# Patient Record
Sex: Female | Born: 1960
Health system: Southern US, Community
[De-identification: ages and names within clinical notes are randomized; demographics above are authoritative.]

## PROBLEM LIST (undated history)

## (undated) DIAGNOSIS — K219 Gastro-esophageal reflux disease without esophagitis: Secondary | ICD-10-CM

## (undated) DIAGNOSIS — E7800 Pure hypercholesterolemia, unspecified: Secondary | ICD-10-CM

## (undated) DIAGNOSIS — E079 Disorder of thyroid, unspecified: Secondary | ICD-10-CM

## (undated) DIAGNOSIS — I10 Essential (primary) hypertension: Secondary | ICD-10-CM

## (undated) HISTORY — PX: REDUCTION MAMMAPLASTY: SUR839

## (undated) HISTORY — PX: TUBAL LIGATION: SHX77

## (undated) HISTORY — PX: BREAST REDUCTION SURGERY: SHX8

---

## 1998-03-30 ENCOUNTER — Other Ambulatory Visit: Admission: RE | Admit: 1998-03-30 | Discharge: 1998-03-30 | Payer: Self-pay | Admitting: Obstetrics and Gynecology

## 1998-07-02 ENCOUNTER — Ambulatory Visit (HOSPITAL_COMMUNITY): Admission: RE | Admit: 1998-07-02 | Discharge: 1998-07-02 | Payer: Self-pay | Admitting: Obstetrics & Gynecology

## 1998-09-15 ENCOUNTER — Inpatient Hospital Stay (HOSPITAL_COMMUNITY): Admission: AD | Admit: 1998-09-15 | Discharge: 1998-09-15 | Payer: Self-pay | Admitting: Obstetrics & Gynecology

## 1998-11-10 ENCOUNTER — Inpatient Hospital Stay (HOSPITAL_COMMUNITY): Admission: AD | Admit: 1998-11-10 | Discharge: 1998-11-12 | Payer: Self-pay | Admitting: Obstetrics & Gynecology

## 1998-12-13 ENCOUNTER — Ambulatory Visit (HOSPITAL_COMMUNITY): Admission: RE | Admit: 1998-12-13 | Discharge: 1998-12-13 | Payer: Self-pay | Admitting: Obstetrics and Gynecology

## 1999-03-24 ENCOUNTER — Emergency Department (HOSPITAL_COMMUNITY): Admission: EM | Admit: 1999-03-24 | Discharge: 1999-03-24 | Payer: Self-pay

## 2000-01-09 ENCOUNTER — Emergency Department (HOSPITAL_COMMUNITY): Admission: EM | Admit: 2000-01-09 | Discharge: 2000-01-09 | Payer: Self-pay | Admitting: Emergency Medicine

## 2000-02-07 ENCOUNTER — Other Ambulatory Visit: Admission: RE | Admit: 2000-02-07 | Discharge: 2000-02-07 | Payer: Self-pay | Admitting: Obstetrics and Gynecology

## 2001-02-27 ENCOUNTER — Encounter: Payer: Self-pay | Admitting: Emergency Medicine

## 2001-02-27 ENCOUNTER — Emergency Department (HOSPITAL_COMMUNITY): Admission: EM | Admit: 2001-02-27 | Discharge: 2001-02-27 | Payer: Self-pay | Admitting: Emergency Medicine

## 2001-03-25 ENCOUNTER — Emergency Department (HOSPITAL_COMMUNITY): Admission: EM | Admit: 2001-03-25 | Discharge: 2001-03-25 | Payer: Self-pay | Admitting: *Deleted

## 2001-04-30 ENCOUNTER — Other Ambulatory Visit: Admission: RE | Admit: 2001-04-30 | Discharge: 2001-04-30 | Payer: Self-pay | Admitting: Obstetrics and Gynecology

## 2001-07-21 ENCOUNTER — Encounter: Payer: Self-pay | Admitting: Emergency Medicine

## 2001-07-21 ENCOUNTER — Emergency Department (HOSPITAL_COMMUNITY): Admission: EM | Admit: 2001-07-21 | Discharge: 2001-07-21 | Payer: Self-pay

## 2001-07-25 ENCOUNTER — Encounter: Payer: Self-pay | Admitting: Emergency Medicine

## 2001-07-25 ENCOUNTER — Emergency Department (HOSPITAL_COMMUNITY): Admission: EM | Admit: 2001-07-25 | Discharge: 2001-07-25 | Payer: Self-pay | Admitting: Emergency Medicine

## 2003-10-30 ENCOUNTER — Encounter (INDEPENDENT_AMBULATORY_CARE_PROVIDER_SITE_OTHER): Payer: Self-pay | Admitting: *Deleted

## 2003-10-30 ENCOUNTER — Ambulatory Visit (HOSPITAL_COMMUNITY): Admission: RE | Admit: 2003-10-30 | Discharge: 2003-10-30 | Payer: Self-pay | Admitting: Specialist

## 2003-10-30 ENCOUNTER — Ambulatory Visit (HOSPITAL_BASED_OUTPATIENT_CLINIC_OR_DEPARTMENT_OTHER): Admission: RE | Admit: 2003-10-30 | Discharge: 2003-10-30 | Payer: Self-pay | Admitting: Specialist

## 2005-07-13 ENCOUNTER — Emergency Department (HOSPITAL_COMMUNITY): Admission: EM | Admit: 2005-07-13 | Discharge: 2005-07-13 | Payer: Self-pay | Admitting: Emergency Medicine

## 2006-07-06 ENCOUNTER — Emergency Department (HOSPITAL_COMMUNITY): Admission: EM | Admit: 2006-07-06 | Discharge: 2006-07-07 | Payer: Self-pay | Admitting: Emergency Medicine

## 2006-10-13 ENCOUNTER — Inpatient Hospital Stay (HOSPITAL_COMMUNITY): Admission: AD | Admit: 2006-10-13 | Discharge: 2006-10-13 | Payer: Self-pay | Admitting: Obstetrics and Gynecology

## 2007-04-06 ENCOUNTER — Other Ambulatory Visit: Admission: RE | Admit: 2007-04-06 | Discharge: 2007-04-06 | Payer: Self-pay | Admitting: Obstetrics and Gynecology

## 2007-05-01 ENCOUNTER — Encounter: Admission: RE | Admit: 2007-05-01 | Discharge: 2007-05-01 | Payer: Self-pay | Admitting: Family Medicine

## 2007-05-03 ENCOUNTER — Encounter: Admission: RE | Admit: 2007-05-03 | Discharge: 2007-05-03 | Payer: Self-pay | Admitting: Obstetrics and Gynecology

## 2007-05-13 ENCOUNTER — Encounter: Admission: RE | Admit: 2007-05-13 | Discharge: 2007-05-13 | Payer: Self-pay | Admitting: Family Medicine

## 2008-07-10 ENCOUNTER — Encounter: Admission: RE | Admit: 2008-07-10 | Discharge: 2008-07-10 | Payer: Self-pay | Admitting: Family Medicine

## 2009-01-12 ENCOUNTER — Inpatient Hospital Stay (HOSPITAL_COMMUNITY): Admission: AD | Admit: 2009-01-12 | Discharge: 2009-01-13 | Payer: Self-pay | Admitting: Internal Medicine

## 2009-01-12 ENCOUNTER — Encounter: Payer: Self-pay | Admitting: Emergency Medicine

## 2009-01-12 ENCOUNTER — Ambulatory Visit: Payer: Self-pay | Admitting: Diagnostic Radiology

## 2009-01-29 ENCOUNTER — Encounter: Admission: RE | Admit: 2009-01-29 | Discharge: 2009-01-29 | Payer: Self-pay | Admitting: Family Medicine

## 2009-02-13 ENCOUNTER — Other Ambulatory Visit: Admission: RE | Admit: 2009-02-13 | Discharge: 2009-02-13 | Payer: Self-pay | Admitting: Obstetrics and Gynecology

## 2009-06-26 ENCOUNTER — Inpatient Hospital Stay (HOSPITAL_COMMUNITY): Admission: EM | Admit: 2009-06-26 | Discharge: 2009-06-29 | Payer: Self-pay | Admitting: Emergency Medicine

## 2009-07-17 ENCOUNTER — Encounter: Admission: RE | Admit: 2009-07-17 | Discharge: 2009-07-17 | Payer: Self-pay | Admitting: Obstetrics and Gynecology

## 2009-08-21 ENCOUNTER — Encounter: Payer: Self-pay | Admitting: Gastroenterology

## 2009-08-23 ENCOUNTER — Encounter: Payer: Self-pay | Admitting: Gastroenterology

## 2009-08-23 ENCOUNTER — Telehealth (INDEPENDENT_AMBULATORY_CARE_PROVIDER_SITE_OTHER): Payer: Self-pay | Admitting: *Deleted

## 2009-08-23 DIAGNOSIS — K859 Acute pancreatitis without necrosis or infection, unspecified: Secondary | ICD-10-CM | POA: Insufficient documentation

## 2009-09-06 ENCOUNTER — Ambulatory Visit (HOSPITAL_COMMUNITY): Admission: RE | Admit: 2009-09-06 | Discharge: 2009-09-06 | Payer: Self-pay | Admitting: Gastroenterology

## 2009-09-06 ENCOUNTER — Ambulatory Visit: Payer: Self-pay | Admitting: Gastroenterology

## 2010-02-12 ENCOUNTER — Encounter: Admission: RE | Admit: 2010-02-12 | Discharge: 2010-02-12 | Payer: Self-pay | Admitting: Family Medicine

## 2010-03-19 ENCOUNTER — Other Ambulatory Visit: Admission: RE | Admit: 2010-03-19 | Discharge: 2010-03-19 | Payer: Self-pay | Admitting: Obstetrics and Gynecology

## 2010-05-27 ENCOUNTER — Emergency Department (HOSPITAL_COMMUNITY): Admission: EM | Admit: 2010-05-27 | Discharge: 2010-05-27 | Payer: Self-pay | Admitting: Emergency Medicine

## 2011-02-13 ENCOUNTER — Emergency Department (INDEPENDENT_AMBULATORY_CARE_PROVIDER_SITE_OTHER): Payer: Managed Care, Other (non HMO)

## 2011-02-13 ENCOUNTER — Emergency Department (HOSPITAL_BASED_OUTPATIENT_CLINIC_OR_DEPARTMENT_OTHER)
Admission: EM | Admit: 2011-02-13 | Discharge: 2011-02-13 | Disposition: A | Discharge status: Home / Self Care | Payer: Managed Care, Other (non HMO) | Attending: Emergency Medicine | Admitting: Emergency Medicine

## 2011-02-13 DIAGNOSIS — I10 Essential (primary) hypertension: Secondary | ICD-10-CM | POA: Insufficient documentation

## 2011-02-13 DIAGNOSIS — E7800 Pure hypercholesterolemia, unspecified: Secondary | ICD-10-CM | POA: Insufficient documentation

## 2011-02-13 DIAGNOSIS — R0602 Shortness of breath: Secondary | ICD-10-CM

## 2011-02-13 DIAGNOSIS — R42 Dizziness and giddiness: Secondary | ICD-10-CM | POA: Insufficient documentation

## 2011-02-13 DIAGNOSIS — R0789 Other chest pain: Secondary | ICD-10-CM

## 2011-02-13 LAB — BASIC METABOLIC PANEL
BUN: 9 mg/dL (ref 6–23)
CO2: 28 mEq/L (ref 19–32)
Calcium: 9.9 mg/dL (ref 8.4–10.5)
Chloride: 108 mEq/L (ref 96–112)
Creatinine, Ser: 0.9 mg/dL (ref 0.4–1.2)
Glucose, Bld: 93 mg/dL (ref 70–99)
Sodium: 146 mEq/L — ABNORMAL HIGH (ref 135–145)

## 2011-02-13 LAB — CBC
Hemoglobin: 13.3 g/dL (ref 12.0–15.0)
MCHC: 33.1 g/dL (ref 30.0–36.0)
RBC: 4.31 MIL/uL (ref 3.87–5.11)

## 2011-02-13 LAB — POCT CARDIAC MARKERS
CKMB, poc: <1 ng/mL — ABNORMAL LOW (ref 1.0–8.0)
Myoglobin, poc: 65.3 ng/mL (ref 12–200)
Myoglobin, poc: 82.2 ng/mL (ref 12–200)

## 2011-02-13 LAB — DIFFERENTIAL
Basophils Absolute: 0 10*3/uL (ref 0.0–0.1)
Lymphs Abs: 1.7 10*3/uL (ref 0.7–4.0)
Neutro Abs: 4.2 10*3/uL (ref 1.7–7.7)

## 2011-03-03 LAB — DIFFERENTIAL
Basophils Relative: 0 % (ref 0–1)
Lymphs Abs: 1.9 10*3/uL (ref 0.7–4.0)
Monocytes Relative: 8 % (ref 3–12)
Neutro Abs: 4.2 10*3/uL (ref 1.7–7.7)
Neutrophils Relative %: 63 % (ref 43–77)

## 2011-03-03 LAB — CBC
Platelets: 269 10*3/uL (ref 150–400)
RDW: 13.4 % (ref 11.5–15.5)
WBC: 6.7 10*3/uL (ref 4.0–10.5)

## 2011-03-03 LAB — POCT URINALYSIS DIP (DEVICE)
Glucose, UA: NEGATIVE mg/dL
Nitrite: NEGATIVE
pH: 7 (ref 5.0–8.0)

## 2011-03-03 LAB — POCT I-STAT, CHEM 8
BUN: 9 mg/dL (ref 6–23)
Calcium, Ion: 1.19 mmol/L (ref 1.12–1.32)
Chloride: 106 mEq/L (ref 96–112)
Creatinine, Ser: 1 mg/dL (ref 0.4–1.2)
Sodium: 140 mEq/L (ref 135–145)
TCO2: 30 mmol/L (ref 0–100)

## 2011-03-20 ENCOUNTER — Other Ambulatory Visit: Payer: Self-pay | Admitting: Family Medicine

## 2011-03-20 DIAGNOSIS — Z1231 Encounter for screening mammogram for malignant neoplasm of breast: Secondary | ICD-10-CM

## 2011-03-23 LAB — DIFFERENTIAL
Basophils Absolute: 0 10*3/uL (ref 0.0–0.1)
Basophils Relative: 0 % (ref 0–1)
Eosinophils Absolute: 0.2 10*3/uL (ref 0.0–0.7)
Lymphs Abs: 1.5 10*3/uL (ref 0.7–4.0)
Monocytes Absolute: 1.1 10*3/uL — ABNORMAL HIGH (ref 0.1–1.0)
Monocytes Relative: 10 % (ref 3–12)
Neutro Abs: 7.9 10*3/uL — ABNORMAL HIGH (ref 1.7–7.7)
Neutrophils Relative %: 73 % (ref 43–77)

## 2011-03-23 LAB — LIPASE, BLOOD: Lipase: 21 U/L (ref 11–59)

## 2011-03-23 LAB — CBC
Hemoglobin: 10.6 g/dL — ABNORMAL LOW (ref 12.0–15.0)
MCHC: 33.5 g/dL (ref 30.0–36.0)
MCV: 97.2 fL (ref 78.0–100.0)
RBC: 3.24 MIL/uL — ABNORMAL LOW (ref 3.87–5.11)
WBC: 10.7 10*3/uL — ABNORMAL HIGH (ref 4.0–10.5)

## 2011-03-23 LAB — HEPATIC FUNCTION PANEL
Albumin: 2.6 g/dL — ABNORMAL LOW (ref 3.5–5.2)
Total Protein: 6 g/dL (ref 6.0–8.3)

## 2011-03-23 LAB — BASIC METABOLIC PANEL
CO2: 26 mEq/L (ref 19–32)
Calcium: 8.6 mg/dL (ref 8.4–10.5)
Chloride: 110 mEq/L (ref 96–112)
Creatinine, Ser: 0.79 mg/dL (ref 0.4–1.2)
GFR calc Af Amer: >60 mL/min (ref >60)
GFR calc non Af Amer: >60 mL/min (ref >60)
Potassium: 3.8 mEq/L (ref 3.5–5.1)
Sodium: 140 mEq/L (ref 135–145)

## 2011-03-24 LAB — URINALYSIS, ROUTINE W REFLEX MICROSCOPIC
Bilirubin Urine: NEGATIVE
Glucose, UA: NEGATIVE mg/dL
Ketones, ur: 15 mg/dL — AB
Leukocytes, UA: NEGATIVE
Nitrite: NEGATIVE
Protein, ur: NEGATIVE mg/dL
Specific Gravity, Urine: 1.031 — ABNORMAL HIGH (ref 1.005–1.030)
pH: 5.5 (ref 5.0–8.0)

## 2011-03-24 LAB — COMPREHENSIVE METABOLIC PANEL
ALT: 17 U/L (ref 0–35)
ALT: 22 U/L (ref 0–35)
AST: 19 U/L (ref 0–37)
Albumin: 2.5 g/dL — ABNORMAL LOW (ref 3.5–5.2)
Albumin: 3.6 g/dL (ref 3.5–5.2)
Alkaline Phosphatase: 54 U/L (ref 39–117)
Alkaline Phosphatase: 80 U/L (ref 39–117)
BUN: 10 mg/dL (ref 6–23)
CO2: 27 mEq/L (ref 19–32)
Chloride: 102 mEq/L (ref 96–112)
Chloride: 108 mEq/L (ref 96–112)
Creatinine, Ser: 0.75 mg/dL (ref 0.4–1.2)
GFR calc Af Amer: >60 mL/min (ref >60)
GFR calc Af Amer: >60 mL/min (ref >60)
GFR calc non Af Amer: >60 mL/min (ref >60)
GFR calc non Af Amer: >60 mL/min (ref >60)
Glucose, Bld: 146 mg/dL — ABNORMAL HIGH (ref 70–99)
Potassium: 3.1 mEq/L — ABNORMAL LOW (ref 3.5–5.1)
Potassium: 3.2 mEq/L — ABNORMAL LOW (ref 3.5–5.1)
Sodium: 139 mEq/L (ref 135–145)
Total Bilirubin: 0.8 mg/dL (ref 0.3–1.2)
Total Bilirubin: 1.5 mg/dL — ABNORMAL HIGH (ref 0.3–1.2)
Total Protein: 7.5 g/dL (ref 6.0–8.3)

## 2011-03-24 LAB — URINE MICROSCOPIC-ADD ON

## 2011-03-24 LAB — BASIC METABOLIC PANEL
BUN: 9 mg/dL (ref 6–23)
CO2: 26 mEq/L (ref 19–32)
CO2: 27 mEq/L (ref 19–32)
Calcium: 7.7 mg/dL — ABNORMAL LOW (ref 8.4–10.5)
Calcium: 7.7 mg/dL — ABNORMAL LOW (ref 8.4–10.5)
Chloride: 107 mEq/L (ref 96–112)
Chloride: 109 mEq/L (ref 96–112)
Creatinine, Ser: 0.83 mg/dL (ref 0.4–1.2)
Creatinine, Ser: 0.9 mg/dL (ref 0.4–1.2)
GFR calc Af Amer: >60 mL/min (ref >60)
GFR calc Af Amer: >60 mL/min (ref >60)
GFR calc non Af Amer: >60 mL/min (ref >60)
GFR calc non Af Amer: >60 mL/min (ref >60)
Potassium: 3.5 mEq/L (ref 3.5–5.1)
Potassium: 3.7 mEq/L (ref 3.5–5.1)
Sodium: 140 mEq/L (ref 135–145)
Sodium: 141 mEq/L (ref 135–145)

## 2011-03-24 LAB — DIFFERENTIAL
Basophils Absolute: 0 10*3/uL (ref 0.0–0.1)
Basophils Absolute: 0 10*3/uL (ref 0.0–0.1)
Basophils Absolute: 0 10*3/uL (ref 0.0–0.1)
Basophils Relative: 0 % (ref 0–1)
Basophils Relative: 0 % (ref 0–1)
Basophils Relative: 0 % (ref 0–1)
Eosinophils Absolute: 0 10*3/uL (ref 0.0–0.7)
Eosinophils Absolute: 0 10*3/uL (ref 0.0–0.7)
Eosinophils Absolute: 0.1 10*3/uL (ref 0.0–0.7)
Eosinophils Relative: 1 % (ref 0–5)
Lymphocytes Relative: 8 % — ABNORMAL LOW (ref 12–46)
Lymphocytes Relative: 8 % — ABNORMAL LOW (ref 12–46)
Lymphs Abs: 0.7 10*3/uL (ref 0.7–4.0)
Lymphs Abs: 1 10*3/uL (ref 0.7–4.0)
Lymphs Abs: 1 10*3/uL (ref 0.7–4.0)
Monocytes Absolute: 0.1 10*3/uL (ref 0.1–1.0)
Monocytes Absolute: 1.2 10*3/uL — ABNORMAL HIGH (ref 0.1–1.0)
Monocytes Absolute: 1.4 10*3/uL — ABNORMAL HIGH (ref 0.1–1.0)
Monocytes Relative: 10 % (ref 3–12)
Monocytes Relative: 10 % (ref 3–12)
Neutro Abs: 11.3 10*3/uL — ABNORMAL HIGH (ref 1.7–7.7)
Neutro Abs: 13.7 10*3/uL — ABNORMAL HIGH (ref 1.7–7.7)
Neutro Abs: 9.9 10*3/uL — ABNORMAL HIGH (ref 1.7–7.7)
Neutrophils Relative %: 80 % — ABNORMAL HIGH (ref 43–77)
Neutrophils Relative %: 82 % — ABNORMAL HIGH (ref 43–77)
Neutrophils Relative %: 94 % — ABNORMAL HIGH (ref 43–77)

## 2011-03-24 LAB — LIVER FUNCTION PROFILE, NEONAT(WH OLY)

## 2011-03-24 LAB — CBC
HCT: 40.9 % (ref 36.0–46.0)
Hemoglobin: 12.1 g/dL (ref 12.0–15.0)
Hemoglobin: 13.7 g/dL (ref 12.0–15.0)
MCHC: 33.9 g/dL (ref 30.0–36.0)
Platelets: 199 10*3/uL (ref 150–400)
RBC: 3.39 MIL/uL — ABNORMAL LOW (ref 3.87–5.11)
RBC: 3.7 MIL/uL — ABNORMAL LOW (ref 3.87–5.11)
RBC: 4.23 MIL/uL (ref 3.87–5.11)
WBC: 12.3 10*3/uL — ABNORMAL HIGH (ref 4.0–10.5)
WBC: 14.5 10*3/uL — ABNORMAL HIGH (ref 4.0–10.5)

## 2011-03-24 LAB — HEPATIC FUNCTION PANEL
AST: 16 U/L (ref 0–37)
Albumin: 2.6 g/dL — ABNORMAL LOW (ref 3.5–5.2)
Alkaline Phosphatase: 54 U/L (ref 39–117)
Bilirubin, Direct: 0.4 mg/dL — ABNORMAL HIGH (ref 0.0–0.3)
Total Bilirubin: 1.5 mg/dL — ABNORMAL HIGH (ref 0.3–1.2)

## 2011-03-24 LAB — URINE CULTURE: Colony Count: 7000

## 2011-03-24 LAB — TSH: TSH: 1.5 u[IU]/mL (ref 0.350–4.500)

## 2011-03-24 LAB — CK TOTAL AND CKMB (NOT AT ARMC)
CK, MB: 1.1 ng/mL (ref 0.3–4.0)
Relative Index: 0.9 (ref 0.0–2.5)
Total CK: 123 U/L (ref 7–177)

## 2011-03-24 LAB — PROTIME-INR
INR: 1 (ref 0.00–1.49)
Prothrombin Time: 13.2 seconds (ref 11.6–15.2)

## 2011-03-24 LAB — APTT: aPTT: 28 seconds (ref 24–37)

## 2011-03-24 LAB — LACTIC ACID, PLASMA: Lactic Acid, Venous: 2 mmol/L (ref 0.5–2.2)

## 2011-03-24 LAB — GLUCOSE, CAPILLARY: Glucose-Capillary: 70 mg/dL (ref 70–99)

## 2011-03-24 LAB — LIPID PANEL
Cholesterol: 118 mg/dL (ref 0–200)
LDL Cholesterol: 58 mg/dL (ref 0–99)
Total CHOL/HDL Ratio: 2.3 RATIO
VLDL: 8 mg/dL (ref 0–40)

## 2011-03-24 LAB — POCT PREGNANCY, URINE: Preg Test, Ur: NEGATIVE

## 2011-03-24 LAB — LIPASE, BLOOD
Lipase: 111 U/L — ABNORMAL HIGH (ref 11–59)
Lipase: 35 U/L (ref 11–59)

## 2011-03-24 LAB — TROPONIN I: Troponin I: 0.01 ng/mL (ref 0.00–0.06)

## 2011-03-26 ENCOUNTER — Ambulatory Visit
Admission: RE | Admit: 2011-03-26 | Discharge: 2011-03-26 | Disposition: A | Discharge status: Home / Self Care | Payer: Managed Care, Other (non HMO) | Source: Ambulatory Visit | Attending: Family Medicine | Admitting: Family Medicine

## 2011-03-26 DIAGNOSIS — Z1231 Encounter for screening mammogram for malignant neoplasm of breast: Secondary | ICD-10-CM

## 2011-03-31 LAB — CBC
HCT: 40.9 % (ref 36.0–46.0)
Hemoglobin: 13.8 g/dL (ref 12.0–15.0)
WBC: 9.2 10*3/uL (ref 4.0–10.5)

## 2011-03-31 LAB — DIFFERENTIAL
Eosinophils Relative: 1 % (ref 0–5)
Lymphocytes Relative: 22 % (ref 12–46)
Lymphs Abs: 2 10*3/uL (ref 0.7–4.0)
Monocytes Absolute: 0.7 10*3/uL (ref 0.1–1.0)
Monocytes Relative: 8 % (ref 3–12)

## 2011-03-31 LAB — CARDIAC PANEL(CRET KIN+CKTOT+MB+TROPI)
CK, MB: 0.9 ng/mL (ref 0.3–4.0)
CK, MB: 1 ng/mL (ref 0.3–4.0)
Relative Index: 0.8 (ref 0.0–2.5)
Relative Index: 0.8 (ref 0.0–2.5)

## 2011-03-31 LAB — POCT CARDIAC MARKERS: Troponin i, poc: <0.05 ng/mL (ref 0.00–0.09)

## 2011-03-31 LAB — LIPID PANEL
Cholesterol: 136 mg/dL (ref 0–200)
LDL Cholesterol: 76 mg/dL (ref 0–99)
Total CHOL/HDL Ratio: 3.1 RATIO
Triglycerides: 80 mg/dL (ref <150)

## 2011-03-31 LAB — BASIC METABOLIC PANEL
Chloride: 102 mEq/L (ref 96–112)
GFR calc non Af Amer: >60 mL/min (ref >60)
Potassium: 4.1 mEq/L (ref 3.5–5.1)
Sodium: 141 mEq/L (ref 135–145)

## 2011-04-29 NOTE — H&P (Signed)
NAME:  EMMALIA, HEYBOER NO.:  [id number]   MEDICAL RECORD NO.:  [id number]          PATIENT TYPE:  INP   LOCATION:  4711                         FACILITY:  [facility]   PHYSICIAN:  Corinna L. Lendell Caprice, MDDATE OF BIRTH:  09-07-1961   DATE OF ADMISSION:  01/12/2009  DATE OF DISCHARGE:                              HISTORY & PHYSICAL   CHIEF COMPLAINT:  Chest pain.   HISTORY OF PRESENT ILLNESS:  Ms. Kimberly Watts is a pleasant 50 year old black  female who was driving to work this morning when she suddenly had left  chest pain.  She alternates describing it as sharp and pressure-like.  It radiated down her arm and up to her neck and face.  She had to pull  over.  It lasted about 15 minutes and was about 7/10 on a pain scale.  She had some nausea with it.  She has had several similar episodes over  the past few weeks one of which woke her up at night and she felt dizzy.  She has a history of panic attacks, which she had had last month, but  this feels different than her previous panic attacks.  She had a stress  test many years ago with [facility], which reportedly was  normal.  She reports that her chest pain comes and goes occasionally  since this morning.  She received a nitroglycerin, which helped.  She  also received aspirin.  Her risk factors for coronary artery disease  include hypertension, hyperlipidemia, and positive family history of  early MI.   PAST MEDICAL HISTORY:  As above.   MEDICATIONS:  1. Simvastatin 80 mg a day.  2. Quinaretic 10/12.5 mg a day.   SOCIAL HISTORY:  She works in [employer].  She is married.  She does not  smoke, drink, or use drugs.   FAMILY HISTORY:  Her father died of a heart attack at age 77.   PAST SURGICAL HISTORY:  Breast reduction and bilateral tubal ligation.   REVIEW OF SYSTEMS:  As above, otherwise negative.   PHYSICAL EXAMINATION:  VITAL SIGNS:  Her temperature is 97.3, blood  pressure 147/85, pulse 88, respiratory rate  17, oxygen saturation 100%  on room air.  GENERAL:  The patient is an obese black female in no acute distress.  HEENT: Normocephalic, atraumatic.  Pupils equal, round, and reactive to  light.  Sclerae nonicteric.  Moist mucous membranes.  NECK:  Supple, no lymphadenopathy, thick.  LUNGS:  Clear to auscultation bilaterally without wheezes, rhonchi, or  rales.  CARDIOVASCULAR:  Regular rate and rhythm without murmurs, gallops, or  rubs.  No chest wall tenderness.  ABDOMEN:  Obese, soft, nontender, nondistended.  GU AND RECTAL:  Deferred.  EXTREMITIES:  No clubbing, cyanosis, or edema.  SKIN:  No rash.  PSYCHIATRIC:  Normal affect, calm and cooperative.  NEUROLOGIC:  Alert and oriented.  Cranial nerves and sensory motor exam  are intact.   EKG shows normal sinus rhythm.  CBC unremarkable.  Basic metabolic panel  unremarkable.  Point-of-care enzymes negative.  Chest x-ray shows mild  peribronchial thickening.   ASSESSMENT AND PLAN:  1. Chest pain:  Somewhat atypical, however, the patient does have      several risk factors.  She will be admitted to telemetry, rule out      for myocardial infarction, discussed inpatient versus outpatient      stress test with Cardiology in the morning.  She will get aspirin,      Nitropaste, try Toradol in case this is musculoskeletal.  2. Hypertension:  Continue outpatient medications.  3. Hyperlipidemia:  Continue outpatient medications and check fasting      lipids.  4. Obesity.  5. Panic attacks give p.r.n. Ativan.      Corinna L. Lendell Caprice, MD  Electronically Signed     CLS/MEDQ  D:  01/12/2009  T:  01/13/2009  Job:  [id number]   cc:   Bryan Lemma. Manus Gunning, M.D.

## 2011-04-29 NOTE — Discharge Summary (Signed)
NAME:  Kimberly Watts, Kimberly Watts NO.:  [id number]   MEDICAL RECORD NO.:  [id number]          PATIENT TYPE:  INP   LOCATION:  1521                         [facility]ACILITY:  [facility]   PHYSICIAN:  Kela Millin, M.D.DATE OF BIRTH:  06-21-1961   DATE OF ADMISSION:  06/25/2009  DATE OF DISCHARGE:  06/29/2009                               DISCHARGE SUMMARY   DISCHARGE DIAGNOSES:  1. Acute pancreatitis - secondary to hydrochlorothiazide, other workup      negative and no history of alcohol.  2. Hypertension.  3. History of hyperlipidemia.  4. Hypokalemia - resolved.  5. Probable hepatic cyst, right lobe - per CT scan of the abdomen,      follow up with primary care physician.   PROCEDURES AND STUDIES:  1. Abdominal ultrasound - negative for gallstones.  Pancreas grossly      normal for ultrasound.  No pancreatic pseudocyst.  2. CT scan of abdomen and pelvis - findings consistent with acute      pancreatitis, no evidence for pseudocyst or abscess.  Probable cyst      within the right hepatic lobe.  3.  Fasting lipid profile - total      cholesterol 118, triglyceride total is 39, HDL is 52, LDL of 58.   CONSULTATIONS:  None.   BRIEF HISTORY:  The patient is a 50 year old black female with the above-  listed medical problems who presented with complaints of abdominal pain.  She reported that the pain was gradual in onset and worsened to 10/10 in  intensity with radiation to her back, no specific aggravating or  alleviating factors noted.  She also admitted to nausea and vomiting -  nonbloody.  She denied any urinary symptoms.  She was seen in the ED and  a lipase level was noted to be elevated at 111 and abdominal series was  negative.  She was admitted for further evaluation and management.   Please see the full admission history and physical dictated on June 26, 2009 by Dr. Alfredia Client for the details of the admission physical exam as  well as the laboratory data.   HOSPITAL  COURSE:  Problem #1.  Acute pancreatitis - upon admission she  was kept n.p.o., started on IV fluids and IV narcotics for pain  management, as well as antiemetics for symptom relief.  An abdominal  ultrasound was done and the results as stated above.  She had a CT scan  of the abdomen subsequently which revealed findings consistent with  acute pancreatitis but no pseudocyst or abscess reported.  Her lipase  was elevated on admission as already mentioned and, with the above  intervention, it gradually improved and today upon discharge her lipase  level was 21.  She also had a fasting lipid profile done and her lipase  was 39 - within normal limits as stated above.  The patient continued to  improve.  She was started on a clear liquid diet which has been advanced  to solids, and she is tolerating this diet well at this time.  Her pain  is decreased and her leukocytosis, which  was thought to be secondary to  the acute pancreatitis, has improved from 14.5 on admission to 10.7  today.  The patient was on Quinaretic (has an hydrochlorothiazide  component) for treatment of her hypertension and the impression is that  the hydrochlorothiazide is the etiology of her pancreatitis.  This has  been discontinued and the patient has been instructed to stay off the  hydrochlorothiazide.  She has been given a prescription for quinapril  which she is to continue and follow up with her primary care physician  for further management.   Problem #2.  Hypokalemia - her potassium was replaced during her  hospital stay and it is 3.8 today prior to discharge.   Problem #3.  Hypertension - her blood pressure medications changed as  above to quinapril 10 mg daily which she is to continue and follow up  with her primary care physician for further monitoring of her blood  pressures and adjustment of medications for optimal blood pressure  control since, as mentioned, the hydrochlorothiazide has been   discontinued.   Problem #4.  History of hyperlipidemia - the patient is to continue her  Zocor and follow up with her primary care physician.   Problem #5.  Right hepatic lobe cyst - incidental finding on CT scan.  The patient is to follow up with her primary care physician.   DISCHARGE MEDICATIONS:  1. Vicodin 5 mg 1 to 2 q.4-6 hours p.r.n. pain.  2. Phenergan 12.5 mg 1 to 2 q.4-6 hours p.r.n. nausea.  3. Quinapril 10 mg p.o. daily.  4. Hydrochlorothiazide discontinued as above.  5. Patient to continue Zocor, omeprazole and aspirin as previously.   FOLLOW-UP CARE:  Dr. Manus Gunning in 1-2 weeks.  The patient to call for  appointment.   DISCHARGE CONDITION:  Improved/stable.      Kela Millin, M.D.  Electronically Signed     ACV/MEDQ  D:  06/29/2009  T:  06/29/2009  Job:  [id number]   cc:   Bryan Lemma. Manus Gunning, M.D.  Fax: [phone]

## 2011-04-29 NOTE — Consult Note (Signed)
NAME:  Kimberly, Watts NO.:  [id number]   MEDICAL RECORD NO.:  [id number]          PATIENT TYPE:  INP   LOCATION:  4711                         FACILITY:  [facility]   PHYSICIAN:  Vesta Mixer, M.D. DATE OF BIRTH:  [birth year]   DATE OF CONSULTATION:  01/13/2009  DATE OF DISCHARGE:  01/13/2009                                 CONSULTATION   Kimberly Watts is a 50 year old female with a history of hypertension,  hyperlipidemia, and obesity.  She is admitted with some episodes of  chest pain.   Treina has been relatively healthy.  She does have a history of  obesity, hypertension, and hyperlipidemia.  She has had some chest pains  about 10-15 years ago and had what sounds like an adenosine Cardiolite  study which was negative.  She has been doing fairly well.  While  driving her car, she had an episode of sharp chest pain.  It radiated to  some degree down her arm and up into her face.  It lasted between 10-15  minutes.  There was no dyspnea or syncope.  The pain resolved  spontaneously.  She presented to the ER and was basically pain free.  She is admitted for further evaluation.   The patient is able to do all normal activities without any significant  chest pain or shortness breath.  She admits that she does not get any  exercise on a regular basis.  She denies any PND or orthopnea.  She  denies any heat or cold intolerance, weight gain, or weight loss.  She  denies any cough or sputum production.   CURRENT MEDICATIONS:  1. Simvastatin once a day  2. Quinapril and hydrochlorothiazide 10 mg/12.5 mg a day.   ALLERGIES:  None.   PAST MEDICAL HISTORY:  1. Hypertension.  2. Hyperlipidemia.  3. Obesity.   SOCIAL HISTORY:  The patient is a nonsmoker.  She works in collections.  She does not get any regular exercise.   FAMILY HISTORY:  Her father died of a myocardial infarction at age 55.   REVIEW OF SYSTEMS:  As per the HPI.  All other systems were reviewed  and  are negative.   PHYSICAL EXAMINATION:  GENERAL:  She is a middle-aged black female in no  acute distress.  She is currently pain free.  VITAL SIGNS:  Her blood pressure is 105/70 with a heart rate of 88.  HEENT:  She is normocephalic and atraumatic.  NECK:  Supple.  There is no JVD.  No thyromegaly.  Carotids are 2+  without bruits.  LUNGS:  Clear.  BACK:  Nontender.  HEART:  Regular rate.  S1 and S2.  Her PMI is nondisplaced.  ABDOMEN:  Obese, but otherwise benign.  She has no hepatosplenomegaly.  There is no masses or tenderness.  EXTREMITIES:  There is no clubbing, cyanosis, or edema.  SKIN:  Normal.  NEURO:  Her gait was not assessed.  Cranial nerves II-XII are intact,  and motor and sensory function intact.   Her cardiac enzymes are all negative.  Her EKG reveals normal sinus  rhythm.  There is no ST or T wave abnormalities.   IMPRESSION AND PLAN:  Chest pain.  The patient had a very brief episode  of chest pain.  Her enzymes are negative and her EKG is nonacute.  I  think at this point we can safely send her home.  We will plan on  getting an adenosine Cardiolite study as an outpatient study (today).  We will add an aspirin 81 mg a day.  She has my card and will call for  any other problems.      Vesta Mixer, M.D.  Electronically Signed     PJN/MEDQ  D:  01/13/2009  T:  01/14/2009  Job:  [id number]   cc:   Bryan Lemma. Manus Gunning, M.D.

## 2011-04-29 NOTE — H&P (Signed)
NAME:  Kimberly Watts, Kimberly Watts NO.:  [id number]   MEDICAL RECORD NO.:  [id number]          PATIENT TYPE:  INP   LOCATION:  1604                         FACILITY:  [facility]   PHYSICIAN:  Lucile Crater, MD         DATE OF BIRTH:  28-Jan-1961   DATE OF ADMISSION:  06/26/2009  DATE OF DISCHARGE:                              HISTORY & PHYSICAL   PRIMARY CARE PHYSICIAN:  Kimberly Watts, M.D.   CHIEF COMPLAINT:  Abdominal pain, in to treat this.   HISTORY OF PRESENT ILLNESS:  Kimberly Watts is a 50 year old African  American female who comes to the ER with abdominal pain which started  three days ago when she was at [facility] with her family.  The patient  was sudden in onset according to the patient and gradually worsened.  She describes it as 10/10 in intensity.  It radiated to the back, but no  specific aggravating or alleviating factors.  The pain was associated  with nausea and vomiting.  She threw up multiple times, but the vomiting  was nonbloody.  She denies any fever or chills.  There is no change in  intensity of the pain with change in position.  The patient never had  similar symptoms in the past.  The pain is sharp in nature.  There is no  relation of the to the foot or bowel movement.  Last menstrual period  was in May 2010.  She denies any chest pain or shortness of breath.  There are no urinary tract symptoms.  The patient was found to have a  lipase of 111 in the emergency room, and acute abdominal series obtained  was negative.  She had symptomatic relief with Zofran and morphine.   REVIEW OF SYSTEMS:  A complete review of systems was done which included  General, Head, Eyes, Ears, Nose, Throat, Cardiovascular, Respiratory,  GI/GU, Endocrine, Musculoskeletal, Neurological, all within normal  limits other than as mentioned above.   PAST MEDICAL HISTORY:  1. Hypertension.  2. Hyperlipidemia.  3. Bilateral breast reduction.  4. Bilateral tubal ligation.   ALLERGIES:  None.   CURRENT MEDICATIONS AT HOME:  1. Zocor 80 mg once daily.  2. Quinaretic 12.5 once a day.   SOCIAL HISTORY:  There is no history of tobacco, alcohol or drugs.  She  is married and lives with her family.   FAMILY HISTORY:  Father had an MI at the age of 58.  There is no history  of pancreatic cancer in the family.  There is no history of gallstones  in the family.   PHYSICAL EXAMINATION:  VITAL SIGNS:  T-max 99.4, blood pressure 132/89,  pulse rate 114, respirations 20, O2 saturation 93% on room air.  GENERAL:  Not in any acute distress.  Alert and oriented to time, place,  person and situation.  HEENT:  Normocephalic, atraumatic.  Pupils equal, round and reactive to  light and accommodation.  Extraocular movements intact.  NECK:  Supple.  No JVD, lymphadenopathy or carotid bruits.  CV:  Regular rhythm.  No murmurs, rubs or gallops.  LUNGS:  Clear to auscultation bilaterally.  ABDOMEN:  Soft, mildly tender diffusely.  No rebound or guarding.  No  hepatosplenomegaly.  EXTREMITIES:  No cyanosis, clubbing or edema.  NEUROLOGICAL:  Grossly nonfocal.   LABORATORIES AND STUDIES:  CBC with diff:  WBC 14,500, hemoglobin 13.7,  hematocrit 40, platelets 265, neutrophils 94%, lymphocytes 5%, monocytes  1%.  Sodium 138, potassium 3.2, chloride 102, bicarbonate 28, BUN 10,  creatinine 0.98, blood glucose 146.  Total protein 1.5, albumin 2.6, ALT  22, AST 24, alk-phos 80, total bilirubin 0.4.  Serum lipase 111.  Urine  pregnancy test is negative.  Lactic acid is 2.0.  Urinalysis shows 11-20  RBC per high-powered field, ketones detected and large amount of blood  with no protein.   ASSESSMENT/PLAN:  1. Acute pancreatitis, consistent with gallstones versus      hypertriglyceridemia versus drugs versus idiopathic.  The patient      does not have a history of alcohol consumption.  We will obtain a      right upper quadrant ultrasound to evaluate for gallbladder and       common bile duct.  If there is a common bile duct stone or      dilatation, will request a GI consult for a possible ERCP.  Her      liver function tests are within normal limits.  There is no      obstructive pattern observed on the liver function tests.  She is      on hydrochlorothiazide at home.  It could contribute to      pancreatitis.  We will hold her blood pressure medications.  Will      check a fasting lipid panel in the morning, dilate for any      hypertriglyceridemia.  It would be unusual for triglycerides less      than 1000 to cause acute pancreatitis.  We will hydrate      aggressively with normal saline.  We will give liberal pain      control.  Will make the patient n.p.o. until she improves      symptomatically.  2. Hypertension at goal.  Will hold off on medications at this point.  3. Hyperlipidemia.  Will check a fasting lipid panel.  Will hold the      statin for now.  4. DVT prophylaxis with SCDs.  5. Fluids, Electrolytes and Nutrition.  Will start normal saline at a      rate of 120 cc an hour.  We will replace her potassium.  She will      be made n.p.o.  This patient will be admitted to the floor and      closely monitored.      Lucile Crater, MD  Electronically Signed     TA/MEDQ  D:  06/26/2009  T:  06/26/2009  Job:  [id number]   cc:   Kimberly Watts, M.D.  Fax: [phone]

## 2011-05-02 NOTE — Op Note (Signed)
NAME:  Kimberly Watts, Kimberly Watts                        ACCOUNT NO.:  [id number]   MEDICAL RECORD NO.:  [id number]                   PATIENT TYPE:  AMB   LOCATION:  DSC                                  FACILITY:  [facility]   PHYSICIAN:  Earvin Hansen L. Shon Hough, M.D.           DATE OF BIRTH:  [birth year]   DATE OF PROCEDURE:  10/30/2003  DATE OF DISCHARGE:                                 OPERATIVE REPORT   A 50 year old lady with severe, severe macromastia, right sided greater than  left. She wears a DDD-E bra. Has had increased pain involving her neck,  shoulders, and back. Also history of intertriginous changes under the breast  tissue requiring her to use creams, medications, and powders to alleviate  the problems. The patient wears a special bra. Even with all of this, she  has had increased complaints.   PROCEDURE PLANNED:  Bilateral breast reduction using the inferior pedicle  technique, reduction of accessory breast tissue.   ANESTHESIA:  General.   SURGEON:  Gerald L. Shon Hough, M.D.   ASSISTANT:  Alethia Berthold, CFA, C-PAO.   PROCEDURE:  The patient was taken to the operating room. She was set up and  drawn for the inferior pedicle reduction mammoplasty, remarking the nipple  areolar complexes from the suprasternal notch to the nipple areolar complex  20 cm. She then underwent general anesthesia, intubated orally. Prep was  done to the chest with Hibiclens solution and walled off with sterile towels  and drapes so as to make a sterile field. Xylocaine 0.25% with epinephrine  was injected locally, 150 cc per side, 1 to 400,000 concentration. After  this, the breasts were allowed set up with the medication inside. The wound  was scored with #15 blades, and the skin of the inferior pedicles were  epithelized with 20 blade. Medial and lateral fatty dermal pedicles were  incised down to the underlying fashion. Laterally, more tissue was taken,  and tremendous amounts of accessory breast tissue  was removed directly as  well as with suction assistance over the serratus anterior latissimus dorsi  and upper axillary regions. The new keyhole area was also debulked, and then  the flaps were transposed and stayed with 3-0 Prolene after proper  hemostasis was maintained. Subcutaneous closure was done with 3-0 Monocryl  x2 levels and then a running subcuticular stitch of 3-0 Monocryl and 5-0  Monocryl throughout the inverted T. The wounds were drained with a #10 Blake  drain, fully fluted, placed in the lateral most portion of the incision and  secured with 3-0 Prolene. The wounds were cleansed. Steri strips and soft  dressing were applied to all the areas including 4 x 4s, ABDs, Hypafix,  Xeroform gauze, and ABD pads. Estimated blood loss 200 cc. Complications  none.  Yaakov Guthrie. Shon Hough, M.D.    Cathie Hoops  D:  10/30/2003  T:  10/30/2003  Job:  [id number]

## 2011-06-03 ENCOUNTER — Other Ambulatory Visit: Payer: Self-pay | Admitting: Obstetrics and Gynecology

## 2011-06-03 ENCOUNTER — Other Ambulatory Visit (HOSPITAL_COMMUNITY)
Admission: RE | Admit: 2011-06-03 | Discharge: 2011-06-03 | Disposition: A | Discharge status: Home / Self Care | Payer: Managed Care, Other (non HMO) | Source: Ambulatory Visit | Attending: Obstetrics and Gynecology | Admitting: Obstetrics and Gynecology

## 2011-06-03 DIAGNOSIS — Z01419 Encounter for gynecological examination (general) (routine) without abnormal findings: Secondary | ICD-10-CM | POA: Insufficient documentation

## 2011-11-05 ENCOUNTER — Other Ambulatory Visit: Payer: Self-pay | Admitting: Family Medicine

## 2011-11-05 DIAGNOSIS — M79609 Pain in unspecified limb: Secondary | ICD-10-CM

## 2011-11-11 ENCOUNTER — Ambulatory Visit
Admission: RE | Admit: 2011-11-11 | Discharge: 2011-11-11 | Disposition: A | Discharge status: Home / Self Care | Payer: Managed Care, Other (non HMO) | Source: Ambulatory Visit | Attending: Family Medicine | Admitting: Family Medicine

## 2011-11-11 DIAGNOSIS — M79609 Pain in unspecified limb: Secondary | ICD-10-CM

## 2011-11-17 ENCOUNTER — Other Ambulatory Visit: Payer: Self-pay | Admitting: Family Medicine

## 2011-11-17 DIAGNOSIS — M79609 Pain in unspecified limb: Secondary | ICD-10-CM

## 2011-11-18 ENCOUNTER — Ambulatory Visit
Admission: RE | Admit: 2011-11-18 | Discharge: 2011-11-18 | Disposition: A | Discharge status: Home / Self Care | Payer: Managed Care, Other (non HMO) | Source: Ambulatory Visit | Attending: Family Medicine | Admitting: Family Medicine

## 2011-11-18 DIAGNOSIS — M79609 Pain in unspecified limb: Secondary | ICD-10-CM

## 2012-04-12 ENCOUNTER — Other Ambulatory Visit: Payer: Self-pay | Admitting: Family Medicine

## 2012-04-12 DIAGNOSIS — Z1231 Encounter for screening mammogram for malignant neoplasm of breast: Secondary | ICD-10-CM

## 2012-04-19 ENCOUNTER — Ambulatory Visit
Admission: RE | Admit: 2012-04-19 | Discharge: 2012-04-19 | Disposition: A | Discharge status: Home / Self Care | Payer: Managed Care, Other (non HMO) | Source: Ambulatory Visit | Attending: Family Medicine | Admitting: Family Medicine

## 2012-04-19 DIAGNOSIS — Z1231 Encounter for screening mammogram for malignant neoplasm of breast: Secondary | ICD-10-CM

## 2012-06-07 ENCOUNTER — Other Ambulatory Visit (HOSPITAL_COMMUNITY)
Admission: RE | Admit: 2012-06-07 | Discharge: 2012-06-07 | Disposition: A | Discharge status: Home / Self Care | Payer: Managed Care, Other (non HMO) | Source: Ambulatory Visit | Attending: Obstetrics and Gynecology | Admitting: Obstetrics and Gynecology

## 2012-06-07 ENCOUNTER — Other Ambulatory Visit: Payer: Self-pay | Admitting: Obstetrics and Gynecology

## 2012-06-07 DIAGNOSIS — Z01419 Encounter for gynecological examination (general) (routine) without abnormal findings: Secondary | ICD-10-CM | POA: Insufficient documentation

## 2012-06-07 DIAGNOSIS — Z1159 Encounter for screening for other viral diseases: Secondary | ICD-10-CM | POA: Insufficient documentation

## 2013-05-12 ENCOUNTER — Emergency Department (HOSPITAL_COMMUNITY): Payer: Managed Care, Other (non HMO)

## 2013-05-12 ENCOUNTER — Encounter (HOSPITAL_COMMUNITY): Payer: Self-pay

## 2013-05-12 ENCOUNTER — Emergency Department (HOSPITAL_COMMUNITY)
Admission: EM | Admit: 2013-05-12 | Discharge: 2013-05-12 | Disposition: A | Discharge status: Home / Self Care | Payer: Managed Care, Other (non HMO) | Attending: Emergency Medicine | Admitting: Emergency Medicine

## 2013-05-12 DIAGNOSIS — E7800 Pure hypercholesterolemia, unspecified: Secondary | ICD-10-CM | POA: Insufficient documentation

## 2013-05-12 DIAGNOSIS — R0602 Shortness of breath: Secondary | ICD-10-CM | POA: Insufficient documentation

## 2013-05-12 DIAGNOSIS — R0981 Nasal congestion: Secondary | ICD-10-CM

## 2013-05-12 DIAGNOSIS — J3489 Other specified disorders of nose and nasal sinuses: Secondary | ICD-10-CM | POA: Insufficient documentation

## 2013-05-12 DIAGNOSIS — K219 Gastro-esophageal reflux disease without esophagitis: Secondary | ICD-10-CM | POA: Insufficient documentation

## 2013-05-12 DIAGNOSIS — I10 Essential (primary) hypertension: Secondary | ICD-10-CM | POA: Insufficient documentation

## 2013-05-12 DIAGNOSIS — Z79899 Other long term (current) drug therapy: Secondary | ICD-10-CM | POA: Insufficient documentation

## 2013-05-12 DIAGNOSIS — Z7982 Long term (current) use of aspirin: Secondary | ICD-10-CM | POA: Insufficient documentation

## 2013-05-12 DIAGNOSIS — R6889 Other general symptoms and signs: Secondary | ICD-10-CM | POA: Insufficient documentation

## 2013-05-12 HISTORY — DX: Essential (primary) hypertension: I10

## 2013-05-12 HISTORY — DX: Pure hypercholesterolemia, unspecified: E78.00

## 2013-05-12 HISTORY — DX: Gastro-esophageal reflux disease without esophagitis: K21.9

## 2013-05-12 LAB — CBC WITH DIFFERENTIAL/PLATELET
Basophils Absolute: 0 10*3/uL (ref 0.0–0.1)
Basophils Relative: 0 % (ref 0–1)
Eosinophils Relative: 1 % (ref 0–5)
HCT: 40.1 % (ref 36.0–46.0)
MCHC: 32.4 g/dL (ref 30.0–36.0)
Monocytes Absolute: 0.7 10*3/uL (ref 0.1–1.0)
Neutro Abs: 3.3 10*3/uL (ref 1.7–7.7)
RDW: 13.3 % (ref 11.5–15.5)

## 2013-05-12 LAB — BASIC METABOLIC PANEL
Calcium: 9.7 mg/dL (ref 8.4–10.5)
Chloride: 103 mEq/L (ref 96–112)
Creatinine, Ser: 0.86 mg/dL (ref 0.50–1.10)
GFR calc Af Amer: 88 mL/min — ABNORMAL LOW (ref >90)
GFR calc non Af Amer: 76 mL/min — ABNORMAL LOW (ref >90)

## 2013-05-12 LAB — PRO B NATRIURETIC PEPTIDE: Pro B Natriuretic peptide (BNP): 31 pg/mL (ref 0–125)

## 2013-05-12 MED ORDER — FLUTICASONE PROPIONATE 50 MCG/ACT NA SUSP: 1.0000 | Freq: Every day | NASAL | Status: DC

## 2013-05-12 MED ORDER — PREDNISONE 20 MG PO TABS
60.0000 mg | ORAL_TABLET | Freq: Once | ORAL | Status: AC
  Administered 2013-05-12: 60 mg via ORAL
  Filled 2013-05-12: qty 3

## 2013-05-12 MED ORDER — LORATADINE 10 MG PO TABS
10.0000 mg | ORAL_TABLET | Freq: Every day | ORAL | Status: DC
  Filled 2013-05-12: qty 1

## 2013-05-12 NOTE — ED Provider Notes (Signed)
Medical screening examination/treatment/procedure(s) were performed by non-physician practitioner and as supervising physician I was immediately available for consultation/collaboration.    Margel Joens R Briena Swingler, MD 05/12/13 1612 

## 2013-05-12 NOTE — ED Notes (Signed)
Pt states that it feels like something is in her throat at night and it waked her up, it's hard for her to breathe and she' has been using afrin, no cough or cold sx

## 2013-05-12 NOTE — ED Provider Notes (Signed)
History     CSN: [id number]  Arrival date & time 05/12/13  7829   None     Chief Complaint  Patient presents with  . Shortness of Breath    (Consider location/radiation/quality/duration/timing/severity/associated sxs/prior treatment) HPI Comments: Pt with PMH significant for HTN, HLP, and GERD presents to the ED for SOB.  Reports that over the past week she has been awoken from her sleep several times and has the sensation that she is "not getting enough air".  Also notes that her nose feels like it is closing up and has a sensation that there is something caught in the back of her throat.  Has been using Afrin every morning and states that after about 20 mins her nose will open up and she is able to breath normally again.  No hx of asthma or allergies.  Denies any new medications or exposure to irritating substances.  No sick contacts, cough, fevers, sweats, or chills.  Denies any chest pain or difficulty swallowing. No LE edema, calf tenderness, recent travel, surgeries, or prolonged periods of immobilization.  The history is provided by the patient.    Past Medical History  Diagnosis Date  . Hypertension   . High cholesterol   . GERD (gastroesophageal reflux disease)     History reviewed. No pertinent past surgical history.  History reviewed. No pertinent family history.  History  Substance Use Topics  . Smoking status: Not on file  . Smokeless tobacco: Not on file  . Alcohol Use: No    OB History   Grav Para Term Preterm Abortions TAB SAB Ect Mult Living                  Review of Systems  HENT: Positive for congestion.   Respiratory: Positive for shortness of breath.   All other systems reviewed and are negative.    Allergies  Review of patient's allergies indicates no known allergies.  Home Medications   Current Outpatient Rx  Name  Route  Sig  Dispense  Refill  . amLODipine-benazepril (LOTREL) 5-10 MG per capsule   Oral   Take 1 capsule by mouth  daily.         Marland Kitchen aspirin 81 MG tablet   Oral   Take 81 mg by mouth daily.         Marland Kitchen atenolol (TENORMIN) 25 MG tablet   Oral   Take 25 mg by mouth daily.         . calcium-vitamin D (OSCAL WITH D) 500-200 MG-UNIT per tablet   Oral   Take 1 tablet by mouth daily.         Marland Kitchen omeprazole (PRILOSEC) 20 MG capsule   Oral   Take 20 mg by mouth daily.         . simvastatin (ZOCOR) 80 MG tablet   Oral   Take 80 mg by mouth at bedtime.           BP 148/93  Pulse 77  Temp(Src) 98.5 F (36.9 C) (Oral)  Resp 18  Ht 5\' 5"  (1.651 m)  Wt 280 lb (127.007 kg)  BMI 46.59 kg/m2  SpO2 98%  Physical Exam  Nursing note and vitals reviewed. Constitutional: She is oriented to person, place, and time. She appears well-developed and well-nourished. No distress.  HENT:  Head: Normocephalic and atraumatic.  Right Ear: Tympanic membrane and ear canal normal.  Left Ear: Tympanic membrane and ear canal normal.  Nose: Mucosal edema present. No rhinorrhea,  septal deviation or nasal septal hematoma.  Mouth/Throat: Uvula is midline, oropharynx is clear and moist and mucous membranes are normal. No oropharyngeal exudate, posterior oropharyngeal edema, posterior oropharyngeal erythema or tonsillar abscesses.  Turbinates swollen and erythematous, airway patent, no evidence of oropharyngeal edema or PTA, handling secretions appropriately  Eyes: Conjunctivae and EOM are normal. Pupils are equal, round, and reactive to light.  Neck: Normal range of motion. Neck supple.  Cardiovascular: Normal rate, regular rhythm and normal heart sounds.   Pulmonary/Chest: Effort normal and breath sounds normal. No respiratory distress. She has no wheezes.  Abdominal: Soft. Bowel sounds are normal. There is no tenderness. There is no guarding.  Musculoskeletal: Normal range of motion. She exhibits no edema.  Calf non-tender bilaterally, no asymmetry or palpable cord, negative Homan's sign  Neurological: She is  alert and oriented to person, place, and time.  Skin: Skin is warm and dry.  Psychiatric: She has a normal mood and affect.    ED Course  Procedures (including critical care time)  Labs Reviewed  BASIC METABOLIC PANEL - Abnormal; Notable for the following:    Glucose, Bld 118 (*)    GFR calc non Af Amer 76 (*)    GFR calc Af Amer 88 (*)    All other components within normal limits  CBC WITH DIFFERENTIAL  PRO B NATRIURETIC PEPTIDE   Dg Chest 2 View  05/12/2013   *RADIOLOGY REPORT*  Clinical Data: New onset of shortness of breath for 2-3 days  CHEST - 2 VIEW  Comparison: 02/13/2011; 01/12/2009  Findings:  Examination is degraded secondary to patient body habitus.  Grossly unchanged cardiac silhouette and mediastinal contours. Minimal basilar heterogeneous opacities favored to represent atelectasis.  No focal airspace opacity.  No definite pleural effusion or pneumothorax.  Unchanged bones.  IMPRESSION: Minimal bibasilar atelectasis without acute cardiopulmonary disease.   Original Report Authenticated By: Tacey Ruiz, MD     1. Shortness of breath   2. Nasal congestion       MDM   52 y.o. F presenting to the ED for intermittent SOB which wakes her from sleep.  No recent sick contacts, cough, fevers, sweats, or chills.  No medication changes.  Pt currently asx.  CXR clear.  Labs largely WNL.  PERC criteria satisfied- low suspicion for PE.  Pt has been on her BP meds (atenolol, and lotrel) for 6+ months without SE, however sx could possibly be related.  Discussed that afrin should not be used on a daily basis- Rx flonase.  Pt will FU with her PCP to discuss this visit and med list.  Discussed plan with pt, she agreed.  Return precautions advised.       Garlon Hatchet, PA-C 05/12/13 1057

## 2013-07-01 ENCOUNTER — Other Ambulatory Visit: Payer: Self-pay

## 2013-07-01 DIAGNOSIS — Z1231 Encounter for screening mammogram for malignant neoplasm of breast: Secondary | ICD-10-CM

## 2013-07-25 ENCOUNTER — Ambulatory Visit
Admission: RE | Admit: 2013-07-25 | Discharge: 2013-07-25 | Disposition: A | Discharge status: Home / Self Care | Payer: Managed Care, Other (non HMO) | Source: Ambulatory Visit

## 2013-07-25 DIAGNOSIS — Z1231 Encounter for screening mammogram for malignant neoplasm of breast: Secondary | ICD-10-CM

## 2014-07-03 ENCOUNTER — Ambulatory Visit
Admission: RE | Admit: 2014-07-03 | Discharge: 2014-07-03 | Disposition: A | Discharge status: Home / Self Care | Payer: Managed Care, Other (non HMO) | Source: Ambulatory Visit | Attending: Family Medicine | Admitting: Family Medicine

## 2014-07-03 ENCOUNTER — Encounter (INDEPENDENT_AMBULATORY_CARE_PROVIDER_SITE_OTHER): Payer: Self-pay

## 2014-07-03 ENCOUNTER — Other Ambulatory Visit: Payer: Self-pay | Admitting: Family Medicine

## 2014-07-03 DIAGNOSIS — M545 Low back pain: Secondary | ICD-10-CM

## 2014-07-20 ENCOUNTER — Other Ambulatory Visit: Payer: Self-pay | Admitting: Orthopedic Surgery

## 2014-07-20 DIAGNOSIS — M545 Low back pain, unspecified: Secondary | ICD-10-CM

## 2014-07-26 ENCOUNTER — Ambulatory Visit
Admission: RE | Admit: 2014-07-26 | Discharge: 2014-07-26 | Disposition: A | Discharge status: Home / Self Care | Payer: Managed Care, Other (non HMO) | Source: Ambulatory Visit | Attending: Orthopedic Surgery | Admitting: Orthopedic Surgery

## 2014-07-26 DIAGNOSIS — M545 Low back pain, unspecified: Secondary | ICD-10-CM

## 2014-09-12 ENCOUNTER — Emergency Department (HOSPITAL_BASED_OUTPATIENT_CLINIC_OR_DEPARTMENT_OTHER)
Admission: EM | Admit: 2014-09-12 | Discharge: 2014-09-12 | Disposition: A | Discharge status: Home / Self Care | Payer: Managed Care, Other (non HMO) | Attending: Emergency Medicine | Admitting: Emergency Medicine

## 2014-09-12 ENCOUNTER — Emergency Department (HOSPITAL_BASED_OUTPATIENT_CLINIC_OR_DEPARTMENT_OTHER): Payer: Managed Care, Other (non HMO)

## 2014-09-12 ENCOUNTER — Encounter (HOSPITAL_BASED_OUTPATIENT_CLINIC_OR_DEPARTMENT_OTHER): Payer: Self-pay | Admitting: Emergency Medicine

## 2014-09-12 DIAGNOSIS — R51 Headache: Secondary | ICD-10-CM | POA: Insufficient documentation

## 2014-09-12 DIAGNOSIS — K219 Gastro-esophageal reflux disease without esophagitis: Secondary | ICD-10-CM | POA: Diagnosis not present

## 2014-09-12 DIAGNOSIS — Z79899 Other long term (current) drug therapy: Secondary | ICD-10-CM | POA: Diagnosis not present

## 2014-09-12 DIAGNOSIS — E7800 Pure hypercholesterolemia, unspecified: Secondary | ICD-10-CM | POA: Diagnosis not present

## 2014-09-12 DIAGNOSIS — R42 Dizziness and giddiness: Secondary | ICD-10-CM | POA: Diagnosis present

## 2014-09-12 DIAGNOSIS — R110 Nausea: Secondary | ICD-10-CM | POA: Diagnosis not present

## 2014-09-12 DIAGNOSIS — Z7982 Long term (current) use of aspirin: Secondary | ICD-10-CM | POA: Insufficient documentation

## 2014-09-12 DIAGNOSIS — IMO0002 Reserved for concepts with insufficient information to code with codable children: Secondary | ICD-10-CM | POA: Diagnosis not present

## 2014-09-12 DIAGNOSIS — I10 Essential (primary) hypertension: Secondary | ICD-10-CM | POA: Insufficient documentation

## 2014-09-12 LAB — BASIC METABOLIC PANEL
Anion gap: 13 (ref 5–15)
BUN: 11 mg/dL (ref 6–23)
CALCIUM: 9.8 mg/dL (ref 8.4–10.5)
CO2: 29 meq/L (ref 19–32)
CREATININE: 1 mg/dL (ref 0.50–1.10)
Chloride: 101 mEq/L (ref 96–112)
GFR calc Af Amer: 73 mL/min — ABNORMAL LOW (ref >90)
GFR calc non Af Amer: 63 mL/min — ABNORMAL LOW (ref >90)
GLUCOSE: 115 mg/dL — AB (ref 70–99)
Potassium: 4 mEq/L (ref 3.7–5.3)
SODIUM: 143 meq/L (ref 137–147)

## 2014-09-12 LAB — CBC WITH DIFFERENTIAL/PLATELET
Basophils Absolute: 0 10*3/uL (ref 0.0–0.1)
Basophils Relative: 0 % (ref 0–1)
EOS ABS: 0.1 10*3/uL (ref 0.0–0.7)
EOS PCT: 2 % (ref 0–5)
HEMATOCRIT: 41.8 % (ref 36.0–46.0)
HEMOGLOBIN: 13.4 g/dL (ref 12.0–15.0)
LYMPHS ABS: 2.1 10*3/uL (ref 0.7–4.0)
LYMPHS PCT: 30 % (ref 12–46)
MCH: 31.5 pg (ref 26.0–34.0)
MCHC: 32.1 g/dL (ref 30.0–36.0)
MCV: 98.1 fL (ref 78.0–100.0)
MONO ABS: 0.8 10*3/uL (ref 0.1–1.0)
MONOS PCT: 12 % (ref 3–12)
Neutro Abs: 3.9 10*3/uL (ref 1.7–7.7)
Neutrophils Relative %: 56 % (ref 43–77)
Platelets: 284 10*3/uL (ref 150–400)
RBC: 4.26 MIL/uL (ref 3.87–5.11)
RDW: 13.3 % (ref 11.5–15.5)
WBC: 7.1 10*3/uL (ref 4.0–10.5)

## 2014-09-12 MED ORDER — MECLIZINE HCL 25 MG PO TABS: 25.0000 mg | ORAL_TABLET | Freq: Four times a day (QID) | ORAL | Status: DC | PRN

## 2014-09-12 MED ORDER — SODIUM CHLORIDE 0.9 % IV BOLUS (SEPSIS)
1000.0000 mL | Freq: Once | INTRAVENOUS | Status: AC
  Administered 2014-09-12: 1000 mL via INTRAVENOUS

## 2014-09-12 MED ORDER — MECLIZINE HCL 25 MG PO TABS
25.0000 mg | ORAL_TABLET | Freq: Once | ORAL | Status: AC
  Administered 2014-09-12: 25 mg via ORAL
  Filled 2014-09-12: qty 1

## 2014-09-12 NOTE — Discharge Instructions (Signed)

## 2014-09-12 NOTE — ED Provider Notes (Signed)
CSN: [id number]     Arrival date & time 09/12/14  2105 History   This chart was scribed for Audree CamelScott T Diago Haik, MD by Milly JakobJohn Lee Graves, ED Scribe. The patient was seen in room MH05/MH05. Patient's care was started at 9:28 PM.  Chief Complaint  Patient presents with  . Dizziness   The history is provided by the patient. No language interpreter was used.   HPI Comments: Kimberly Watts is a 53 y.o. female who presents to the Emergency Department complaining of intermittent dizziness onset 1 month ago. She describes the dizziness, "like the room is spinning." She repots headache and nausea during the dizziness, but not otherwise. She reports that she had an acute episode lasting almost thirty minutes last night. She denies palpitations. She denies anything specifically bringing on the dizziness, but reports that it almost always occurs at night. She reports a recent URI and states that her ears feel congested. She reports taking Claritin with minimal relief. She reports a history of HTN and high cholesterol. She denies blurry vision.   Past Medical History  Diagnosis Date  . Hypertension   . High cholesterol   . GERD (gastroesophageal reflux disease)    History reviewed. No pertinent past surgical history. No family history on file. History  Substance Use Topics  . Smoking status: Never Smoker   . Smokeless tobacco: Not on file  . Alcohol Use: No   OB History   Grav Para Term Preterm Abortions TAB SAB Ect Mult Living                 Review of Systems  Constitutional: Negative for fever.  Eyes: Negative for visual disturbance.  Gastrointestinal: Positive for nausea. Negative for vomiting.  Neurological: Positive for dizziness and headaches. Negative for weakness.  All other systems reviewed and are negative.  Allergies  Review of patient's allergies indicates no known allergies.  Home Medications   Prior to Admission medications   Medication Sig Start Date End Date Taking?  Authorizing Provider  amLODipine-benazepril (LOTREL) 5-10 MG per capsule Take 1 capsule by mouth daily.    Historical Provider, MD  aspirin 81 MG tablet Take 81 mg by mouth daily.    Historical Provider, MD  atenolol (TENORMIN) 25 MG tablet Take 25 mg by mouth daily.    Historical Provider, MD  calcium-vitamin D (OSCAL WITH D) 500-200 MG-UNIT per tablet Take 1 tablet by mouth daily.    Historical Provider, MD  fluticasone (FLONASE) 50 MCG/ACT nasal spray Place 1-2 sprays into the nose daily. 05/12/13   Garlon HatchetLisa M Sanders, PA-C  omeprazole (PRILOSEC) 20 MG capsule Take 20 mg by mouth daily.    Historical Provider, MD  simvastatin (ZOCOR) 80 MG tablet Take 80 mg by mouth at bedtime.    Historical Provider, MD   Triage vitals: BP 154/71  Pulse 80  Temp(Src) 97.6 F (36.4 C) (Oral)  Resp 20  Ht 5\' 5"  (1.651 m)  Wt 295 lb (133.811 kg)  BMI 49.09 kg/m2  SpO2 98% Physical Exam  Nursing note and vitals reviewed. Constitutional: She is oriented to person, place, and time. She appears well-developed and well-nourished. No distress.  HENT:  Head: Normocephalic and atraumatic.  Right Ear: Tympanic membrane and external ear normal.  Left Ear: Tympanic membrane and external ear normal.  Eyes: Conjunctivae and EOM are normal. Pupils are equal, round, and reactive to light. No scleral icterus. Right eye exhibits no nystagmus. Left eye exhibits no nystagmus.  Neck: Neck supple.  No tracheal deviation present.  No neck stiffness  Cardiovascular: Normal rate, regular rhythm and normal heart sounds.   No murmur heard. Pulmonary/Chest: Effort normal and breath sounds normal. No respiratory distress. She has no wheezes.  Abdominal: Soft. There is no tenderness.  Musculoskeletal: Normal range of motion.  Neurological: She is alert and oriented to person, place, and time.  Cranial nervs 2-12 grossly intact.5/5 strength in all 4 extremities. Normal FTN. Normal heel to shin. Normal rapid alternating movements.  Normal gait.  Skin: Skin is warm and dry.  Psychiatric: She has a normal mood and affect. Her behavior is normal.    ED Course  Procedures (including critical care time) DIAGNOSTIC STUDIES: Oxygen Saturation is 98% on room air, normal by my interpretation.    COORDINATION OF CARE: 9:34 PM-Discussed treatment plan which includes head CT-Scan with pt at bedside and pt agreed to plan.  Labs Review Labs Reviewed  BASIC METABOLIC PANEL - Abnormal; Notable for the following:    Glucose, Bld 115 (*)    GFR calc non Af Amer 63 (*)    GFR calc Af Amer 73 (*)    All other components within normal limits  CBC WITH DIFFERENTIAL    Imaging Review Ct Head Wo Contrast  09/12/2014   CLINICAL DATA:  Dizziness, headache  EXAM: CT HEAD WITHOUT CONTRAST  TECHNIQUE: Contiguous axial images were obtained from the base of the skull through the vertex without intravenous contrast.  COMPARISON:  07/07/2006  FINDINGS: No evidence of parenchymal hemorrhage or extra-axial fluid collection. No mass lesion, mass effect, or midline shift.  No CT evidence of acute infarction.  Mild cortical atrophy.  No ventriculomegaly.  The visualized paranasal sinuses are essentially clear. The mastoid air cells are unopacified.  No evidence of calvarial fracture.  IMPRESSION: No evidence of acute intracranial abnormality.  Mild cortical atrophy.   Electronically Signed   By: Charline Bills M.D.   On: 09/12/2014 22:29     EKG Interpretation   Date/Time:  Tuesday September 12 2014 21:49:28 EDT Ventricular Rate:  80 PR Interval:  188 QRS Duration: 88 QT Interval:  398 QTC Calculation: 459 R Axis:   2 Text Interpretation:  Normal sinus rhythm Minimal voltage criteria for  LVH, may be normal variant Borderline ECG nonspecific ST segments in V4-6  new compared to 2012 Confirmed by Abbigaile Rockman  MD, Tambra Muller ([id number]) on 09/12/2014  9:55:17 PM      MDM   Final diagnoses:  Vertigo    Patient with intermittent vertigo over the  past month. This is new onset symptom for her. Symptoms do not correlate with any type of specific movement. They resolve spontaneously. No current nystagmus or neurologic deficits. She feels a little dizzy on my exam and was given Antivert with resolution of her symptoms. Workup here is negative. She has a normal gait feel she is stable for discharge with outpatient followup with her PCP. Discussed strict return precautions.  I personally performed the services described in this documentation, which was scribed in my presence. The recorded information has been reviewed and is accurate.    Audree Camel, MD 09/12/14 2258

## 2014-09-12 NOTE — ED Notes (Signed)
Recent sinus infection. C.o dizziness and headache for the past 4 weeks.

## 2015-05-01 ENCOUNTER — Other Ambulatory Visit (HOSPITAL_COMMUNITY)
Admission: RE | Admit: 2015-05-01 | Discharge: 2015-05-01 | Disposition: A | Discharge status: Home / Self Care | Payer: Managed Care, Other (non HMO) | Source: Ambulatory Visit | Attending: Obstetrics and Gynecology | Admitting: Obstetrics and Gynecology

## 2015-05-01 ENCOUNTER — Other Ambulatory Visit: Payer: Self-pay | Admitting: Obstetrics and Gynecology

## 2015-05-01 DIAGNOSIS — Z1151 Encounter for screening for human papillomavirus (HPV): Secondary | ICD-10-CM | POA: Diagnosis present

## 2015-05-01 DIAGNOSIS — Z01419 Encounter for gynecological examination (general) (routine) without abnormal findings: Secondary | ICD-10-CM | POA: Diagnosis present

## 2015-05-02 LAB — CYTOLOGY - PAP

## 2016-05-06 ENCOUNTER — Other Ambulatory Visit: Payer: Self-pay

## 2016-05-06 DIAGNOSIS — Z1231 Encounter for screening mammogram for malignant neoplasm of breast: Secondary | ICD-10-CM

## 2016-05-27 ENCOUNTER — Ambulatory Visit
Admission: RE | Admit: 2016-05-27 | Discharge: 2016-05-27 | Disposition: A | Discharge status: Home / Self Care | Payer: Managed Care, Other (non HMO) | Source: Ambulatory Visit

## 2016-05-27 DIAGNOSIS — Z1231 Encounter for screening mammogram for malignant neoplasm of breast: Secondary | ICD-10-CM

## 2016-10-06 ENCOUNTER — Other Ambulatory Visit: Payer: Self-pay | Admitting: Gastroenterology

## 2016-10-06 DIAGNOSIS — R1084 Generalized abdominal pain: Secondary | ICD-10-CM

## 2016-10-10 ENCOUNTER — Ambulatory Visit
Admission: RE | Admit: 2016-10-10 | Discharge: 2016-10-10 | Disposition: A | Discharge status: Home / Self Care | Payer: Managed Care, Other (non HMO) | Source: Ambulatory Visit | Attending: Gastroenterology | Admitting: Gastroenterology

## 2016-10-10 DIAGNOSIS — R1084 Generalized abdominal pain: Secondary | ICD-10-CM

## 2016-10-10 MED ORDER — IOPAMIDOL (ISOVUE-300) INJECTION 61%
125.0000 mL | Freq: Once | INTRAVENOUS | Status: AC | PRN
  Administered 2016-10-10: 125 mL via INTRAVENOUS

## 2017-12-21 ENCOUNTER — Emergency Department (HOSPITAL_COMMUNITY): Payer: Managed Care, Other (non HMO)

## 2017-12-21 ENCOUNTER — Observation Stay (HOSPITAL_COMMUNITY)
Admission: EM | Admit: 2017-12-21 | Discharge: 2017-12-22 | Disposition: A | Discharge status: Home / Self Care | Payer: Managed Care, Other (non HMO) | Attending: Internal Medicine | Admitting: Internal Medicine

## 2017-12-21 ENCOUNTER — Encounter (HOSPITAL_COMMUNITY): Payer: Self-pay | Admitting: Emergency Medicine

## 2017-12-21 ENCOUNTER — Inpatient Hospital Stay (HOSPITAL_COMMUNITY): Payer: Managed Care, Other (non HMO)

## 2017-12-21 ENCOUNTER — Other Ambulatory Visit: Payer: Self-pay

## 2017-12-21 DIAGNOSIS — Z6841 Body Mass Index (BMI) 40.0 and over, adult: Secondary | ICD-10-CM | POA: Insufficient documentation

## 2017-12-21 DIAGNOSIS — Z8249 Family history of ischemic heart disease and other diseases of the circulatory system: Secondary | ICD-10-CM | POA: Insufficient documentation

## 2017-12-21 DIAGNOSIS — G459 Transient cerebral ischemic attack, unspecified: Secondary | ICD-10-CM | POA: Diagnosis not present

## 2017-12-21 DIAGNOSIS — E785 Hyperlipidemia, unspecified: Secondary | ICD-10-CM | POA: Diagnosis not present

## 2017-12-21 DIAGNOSIS — E079 Disorder of thyroid, unspecified: Secondary | ICD-10-CM | POA: Insufficient documentation

## 2017-12-21 DIAGNOSIS — Z7902 Long term (current) use of antithrombotics/antiplatelets: Secondary | ICD-10-CM | POA: Insufficient documentation

## 2017-12-21 DIAGNOSIS — Z79899 Other long term (current) drug therapy: Secondary | ICD-10-CM | POA: Diagnosis not present

## 2017-12-21 DIAGNOSIS — Z823 Family history of stroke: Secondary | ICD-10-CM | POA: Diagnosis not present

## 2017-12-21 DIAGNOSIS — N390 Urinary tract infection, site not specified: Secondary | ICD-10-CM | POA: Diagnosis not present

## 2017-12-21 DIAGNOSIS — E039 Hypothyroidism, unspecified: Secondary | ICD-10-CM | POA: Diagnosis not present

## 2017-12-21 DIAGNOSIS — I10 Essential (primary) hypertension: Secondary | ICD-10-CM | POA: Diagnosis not present

## 2017-12-21 DIAGNOSIS — N3000 Acute cystitis without hematuria: Secondary | ICD-10-CM | POA: Diagnosis not present

## 2017-12-21 DIAGNOSIS — R531 Weakness: Secondary | ICD-10-CM

## 2017-12-21 DIAGNOSIS — R42 Dizziness and giddiness: Secondary | ICD-10-CM | POA: Diagnosis present

## 2017-12-21 DIAGNOSIS — E7800 Pure hypercholesterolemia, unspecified: Secondary | ICD-10-CM | POA: Insufficient documentation

## 2017-12-21 DIAGNOSIS — K219 Gastro-esophageal reflux disease without esophagitis: Secondary | ICD-10-CM | POA: Diagnosis not present

## 2017-12-21 DIAGNOSIS — Z7982 Long term (current) use of aspirin: Secondary | ICD-10-CM | POA: Diagnosis not present

## 2017-12-21 HISTORY — DX: Disorder of thyroid, unspecified: E07.9

## 2017-12-21 LAB — COMPREHENSIVE METABOLIC PANEL
ALT: 20 U/L (ref 14–54)
ANION GAP: 9 (ref 5–15)
AST: 20 U/L (ref 15–41)
Albumin: 3.9 g/dL (ref 3.5–5.0)
Alkaline Phosphatase: 99 U/L (ref 38–126)
BUN: 14 mg/dL (ref 6–20)
CHLORIDE: 105 mmol/L (ref 101–111)
CO2: 26 mmol/L (ref 22–32)
Calcium: 9.5 mg/dL (ref 8.9–10.3)
Creatinine, Ser: 0.93 mg/dL (ref 0.44–1.00)
GFR calc non Af Amer: >60 mL/min (ref >60)
Glucose, Bld: 115 mg/dL — ABNORMAL HIGH (ref 65–99)
Potassium: 4 mmol/L (ref 3.5–5.1)
SODIUM: 140 mmol/L (ref 135–145)
Total Bilirubin: 1 mg/dL (ref 0.3–1.2)
Total Protein: 7.5 g/dL (ref 6.5–8.1)

## 2017-12-21 LAB — WET PREP, GENITAL
CLUE CELLS WET PREP: NONE SEEN
Sperm: NONE SEEN
Trich, Wet Prep: NONE SEEN
Yeast Wet Prep HPF POC: NONE SEEN

## 2017-12-21 LAB — I-STAT CHEM 8, ED
BUN: 14 mg/dL (ref 6–20)
CALCIUM ION: 1.21 mmol/L (ref 1.15–1.40)
CHLORIDE: 104 mmol/L (ref 101–111)
Creatinine, Ser: 1 mg/dL (ref 0.44–1.00)
Glucose, Bld: 114 mg/dL — ABNORMAL HIGH (ref 65–99)
HCT: 43 % (ref 36.0–46.0)
Hemoglobin: 14.6 g/dL (ref 12.0–15.0)
Potassium: 4.1 mmol/L (ref 3.5–5.1)
SODIUM: 142 mmol/L (ref 135–145)
TCO2: 27 mmol/L (ref 22–32)

## 2017-12-21 LAB — URINALYSIS, ROUTINE W REFLEX MICROSCOPIC
BILIRUBIN URINE: NEGATIVE
Glucose, UA: NEGATIVE mg/dL
KETONES UR: NEGATIVE mg/dL
NITRITE: NEGATIVE
PROTEIN: NEGATIVE mg/dL
Specific Gravity, Urine: 1.017 (ref 1.005–1.030)
pH: 5 (ref 5.0–8.0)

## 2017-12-21 LAB — RAPID URINE DRUG SCREEN, HOSP PERFORMED
AMPHETAMINES: NOT DETECTED
BENZODIAZEPINES: NOT DETECTED
Barbiturates: NOT DETECTED
Cocaine: NOT DETECTED
OPIATES: NOT DETECTED
Tetrahydrocannabinol: NOT DETECTED

## 2017-12-21 LAB — DIFFERENTIAL
BASOS PCT: 0 %
Basophils Absolute: 0 10*3/uL (ref 0.0–0.1)
EOS ABS: 0.1 10*3/uL (ref 0.0–0.7)
EOS PCT: 2 %
Lymphocytes Relative: 31 %
Lymphs Abs: 2.2 10*3/uL (ref 0.7–4.0)
MONO ABS: 0.5 10*3/uL (ref 0.1–1.0)
Monocytes Relative: 8 %
Neutro Abs: 4.2 10*3/uL (ref 1.7–7.7)
Neutrophils Relative %: 59 %

## 2017-12-21 LAB — PROTIME-INR
INR: 0.95
PROTHROMBIN TIME: 12.6 s (ref 11.4–15.2)

## 2017-12-21 LAB — CBC
HCT: 42.4 % (ref 36.0–46.0)
Hemoglobin: 13.7 g/dL (ref 12.0–15.0)
MCH: 31.4 pg (ref 26.0–34.0)
MCHC: 32.3 g/dL (ref 30.0–36.0)
MCV: 97.2 fL (ref 78.0–100.0)
Platelets: 250 10*3/uL (ref 150–400)
RBC: 4.36 MIL/uL (ref 3.87–5.11)
RDW: 13.3 % (ref 11.5–15.5)
WBC: 7 10*3/uL (ref 4.0–10.5)

## 2017-12-21 LAB — ETHANOL

## 2017-12-21 LAB — I-STAT TROPONIN, ED: Troponin i, poc: 0 ng/mL (ref 0.00–0.08)

## 2017-12-21 LAB — APTT: aPTT: 28 seconds (ref 24–36)

## 2017-12-21 LAB — I-STAT BETA HCG BLOOD, ED (MC, WL, AP ONLY): I-stat hCG, quantitative: <5 m[IU]/mL (ref <5)

## 2017-12-21 MED ORDER — LEVOTHYROXINE SODIUM 50 MCG PO TABS
50.0000 ug | ORAL_TABLET | Freq: Every day | ORAL | Status: DC
  Administered 2017-12-21: 50 ug via ORAL
  Filled 2017-12-21 (×2): qty 1

## 2017-12-21 MED ORDER — AMLODIPINE BESY-BENAZEPRIL HCL 5-10 MG PO CAPS: 1.0000 | ORAL_CAPSULE | Freq: Every day | ORAL | Status: DC

## 2017-12-21 MED ORDER — CALCIUM CARBONATE-VITAMIN D 500-200 MG-UNIT PO TABS
1.0000 | ORAL_TABLET | Freq: Every day | ORAL | Status: DC
  Administered 2017-12-21 – 2017-12-22 (×2): 1 via ORAL
  Filled 2017-12-21 (×2): qty 1

## 2017-12-21 MED ORDER — ASPIRIN 81 MG PO CHEW
324.0000 mg | CHEWABLE_TABLET | Freq: Once | ORAL | Status: AC
  Administered 2017-12-21: 324 mg via ORAL
  Filled 2017-12-21: qty 4

## 2017-12-21 MED ORDER — IOPAMIDOL (ISOVUE-300) INJECTION 61%
100.0000 mL | Freq: Once | INTRAVENOUS | Status: AC | PRN
  Administered 2017-12-21: 100 mL via INTRAVENOUS

## 2017-12-21 MED ORDER — ACETAMINOPHEN 160 MG/5ML PO SOLN: 650.0000 mg | ORAL | Status: DC | PRN

## 2017-12-21 MED ORDER — MECLIZINE HCL 12.5 MG PO TABS: 25.0000 mg | ORAL_TABLET | Freq: Four times a day (QID) | ORAL | Status: DC | PRN

## 2017-12-21 MED ORDER — CEFTRIAXONE SODIUM 2 G IJ SOLR
2.0000 g | INTRAMUSCULAR | Status: AC
  Administered 2017-12-21: 2 g via INTRAVENOUS

## 2017-12-21 MED ORDER — BENAZEPRIL HCL 20 MG PO TABS
10.0000 mg | ORAL_TABLET | Freq: Every day | ORAL | Status: DC
  Administered 2017-12-21 – 2017-12-22 (×2): 10 mg via ORAL
  Filled 2017-12-21 (×2): qty 1

## 2017-12-21 MED ORDER — PANTOPRAZOLE SODIUM 40 MG PO TBEC
40.0000 mg | DELAYED_RELEASE_TABLET | Freq: Every day | ORAL | Status: DC
  Administered 2017-12-22: 40 mg via ORAL
  Filled 2017-12-21: qty 1

## 2017-12-21 MED ORDER — LORAZEPAM 2 MG/ML IJ SOLN
0.5000 mg | Freq: Once | INTRAMUSCULAR | Status: AC
  Administered 2017-12-21: 0.5 mg via INTRAVENOUS
  Filled 2017-12-21: qty 1

## 2017-12-21 MED ORDER — DICYCLOMINE HCL 20 MG PO TABS: 20.0000 mg | ORAL_TABLET | Freq: Four times a day (QID) | ORAL | Status: DC | PRN

## 2017-12-21 MED ORDER — ACETAMINOPHEN 325 MG PO TABS: 650.0000 mg | ORAL_TABLET | ORAL | Status: DC | PRN

## 2017-12-21 MED ORDER — AMLODIPINE BESYLATE 5 MG PO TABS
5.0000 mg | ORAL_TABLET | Freq: Every day | ORAL | Status: DC
  Administered 2017-12-21 – 2017-12-22 (×2): 5 mg via ORAL
  Filled 2017-12-21 (×2): qty 1

## 2017-12-21 MED ORDER — HYDRALAZINE HCL 20 MG/ML IJ SOLN: 5.0000 mg | INTRAMUSCULAR | Status: DC | PRN

## 2017-12-21 MED ORDER — ASPIRIN 81 MG PO CHEW
81.0000 mg | CHEWABLE_TABLET | Freq: Every day | ORAL | Status: DC
  Administered 2017-12-22: 81 mg via ORAL
  Filled 2017-12-21: qty 1

## 2017-12-21 MED ORDER — ENOXAPARIN SODIUM 40 MG/0.4ML ~~LOC~~ SOLN
40.0000 mg | SUBCUTANEOUS | Status: DC
  Administered 2017-12-21: 40 mg via SUBCUTANEOUS
  Filled 2017-12-21 (×2): qty 0.4

## 2017-12-21 MED ORDER — ATENOLOL 25 MG PO TABS
25.0000 mg | ORAL_TABLET | Freq: Every day | ORAL | Status: DC
  Administered 2017-12-21 – 2017-12-22 (×2): 25 mg via ORAL
  Filled 2017-12-21 (×2): qty 1

## 2017-12-21 MED ORDER — STROKE: EARLY STAGES OF RECOVERY BOOK
Freq: Once | Status: AC
  Administered 2017-12-21: 16:00:00
  Filled 2017-12-21: qty 1

## 2017-12-21 MED ORDER — MORPHINE SULFATE (PF) 4 MG/ML IV SOLN
4.0000 mg | Freq: Once | INTRAVENOUS | Status: AC
  Administered 2017-12-21: 4 mg via INTRAVENOUS
  Filled 2017-12-21: qty 1

## 2017-12-21 MED ORDER — ATORVASTATIN CALCIUM 40 MG PO TABS
40.0000 mg | ORAL_TABLET | Freq: Every day | ORAL | Status: DC
  Administered 2017-12-21: 40 mg via ORAL
  Filled 2017-12-21: qty 1

## 2017-12-21 MED ORDER — IOPAMIDOL (ISOVUE-300) INJECTION 61%
INTRAVENOUS | Status: AC
  Filled 2017-12-21: qty 100

## 2017-12-21 MED ORDER — HYDROCODONE-ACETAMINOPHEN 5-325 MG PO TABS
1.0000 | ORAL_TABLET | ORAL | Status: DC | PRN
  Administered 2017-12-21 (×2): 1 via ORAL
  Filled 2017-12-21 (×2): qty 1

## 2017-12-21 MED ORDER — ONDANSETRON HCL 4 MG/2ML IJ SOLN
4.0000 mg | Freq: Once | INTRAMUSCULAR | Status: AC
  Administered 2017-12-21: 4 mg via INTRAVENOUS
  Filled 2017-12-21: qty 2

## 2017-12-21 MED ORDER — ACETAMINOPHEN 650 MG RE SUPP: 650.0000 mg | RECTAL | Status: DC | PRN

## 2017-12-21 MED ORDER — DEXTROSE 5 % IV SOLN: 1.0000 g | Freq: Once | INTRAVENOUS | Status: DC

## 2017-12-21 MED ORDER — DEXTROSE 5 % IV SOLN
2.0000 g | INTRAVENOUS | Status: DC
  Administered 2017-12-22: 2 g via INTRAVENOUS
  Filled 2017-12-21: qty 2

## 2017-12-21 NOTE — H&P (Addendum)
History and Physical    Kimberly ReapLoretha J Clutter BJY:[id number] DOB: 10/29/1961 DOA: 12/21/2017  Referring MD/NP/PA: Dr. Rhunette CroftNanavati   PCP: System, Pcp Not In   Patient coming from: home   Chief Complaint: right sided facial numbness, slurred speech, RUE weakness, dizziness   HPI: Kimberly Watts is a 57 y.o. female with medical history significant of HTN, hypothyroidism, [facility] with main concern of several days duration of progressively worsening lower quadrants abd pain that was mostly intermittent and throbbing, 5/10 in severity, not radiating, associated with dysuria. She has seen her PCP and was given prescription for ABX but says her symptoms have not improved. Patient also reports waking up at night time feeling dizzy, had one short episodes of slurred speech and felt somewhat week on the right side, arm > leg. Her symptoms are now mostly resolved but she says she still has some right facial numbness and right upper extremity weakness.   ED Course: In ED, pt is hemodynamically stable, UA consistent with UTI, pt started on Rocephin . CT head done and with no acute strokes noted. Due to high concern of stroke, it was determined that pt needs further work up, [facility] stroke rule out.   Review of Systems:  Constitutional: Negative for fever, chills HENT: Negative for ear pain, nosebleeds, congestion, facial swelling, rhinorrhea, neck pain, neck stiffness and ear discharge.   Eyes: Negative for pain, discharge, redness, itching and visual disturbance.  Respiratory: Negative for cough, choking, chest tightness, shortness of breath, wheezing and stridor.   Cardiovascular: Negative for chest pain, palpitations and leg swelling.  Gastrointestinal: Negative for abdominal distention.  Genitourinary: Negative for hematuria, flank pain Musculoskeletal: Negative for back pain, joint swelling, arthralgias and gait problem.  Neurological: Negative for tremors, seizures, headaches.    Hematological: Negative for adenopathy. Does not bruise/bleed easily.  Psychiatric/Behavioral: Negative for hallucinations, behavioral problems, confusion, dysphoric mood, decreased concentration and agitation.   Past Medical History:  Diagnosis Date  . GERD (gastroesophageal reflux disease)   . High cholesterol   . Hypertension   . Thyroid disease     Past Surgical History:  Procedure Laterality Date  . BREAST REDUCTION SURGERY     Social Hx;  reports that  has never smoked. she has never used smokeless tobacco. She reports that she does not drink alcohol or use drugs.  No Known Allergies  Family History  Problem Relation Age of Onset  . Stroke Mother   . Diabetes Mother   . Hypertension Mother   . Diabetes Father   . Hypertension Father   . Heart attack Father     Medication Sig  amLODipine-benazepril 5-10 MG per capsule Take 1 capsule by mouth daily.  aspirin 81 MG tablet Take 81 mg by mouth daily.  atenolol (TENORMIN) 25 MG tablet Take 25 mg by mouth daily.  dicyclomine (BENTYL) 20 MG tablet Take 20 mg by mouth every 6 (six) hours as needed.  levothyroxine (SYNTHROID, LEVOTHROID) 50  Take 50 mcg by mouth at bedtime.  omeprazole (PRILOSEC) 20 MG capsule Take 20 mg by mouth daily.  simvastatin (ZOCOR) 80 MG tablet Take 80 mg by mouth at bedtime.   Physical Exam: Vitals:   12/21/17 0355 12/21/17 0834 12/21/17 0954 12/21/17 1145  BP: (!) 142/94 (!) 170/90    Pulse: 74 (!) 56    Resp: 17 15    Temp: 97.8 F (36.6 C)  97.8 F (36.6 C)   TempSrc: Oral  SpO2: 97% 98%    Weight:    123.8 kg (273 lb)  Height:    5\' 5"  (1.651 m)    Constitutional: NAD, calm, comfortable Vitals:   12/21/17 0355 12/21/17 0834 12/21/17 0954 12/21/17 1145  BP: (!) 142/94 (!) 170/90    Pulse: 74 (!) 56    Resp: 17 15    Temp: 97.8 F (36.6 C)  97.8 F (36.6 C)   TempSrc: Oral     SpO2: 97% 98%    Weight:    123.8 kg (273 lb)  Height:    5\' 5"  (1.651 m)   Eyes: PERRL, lids  and conjunctivae normal ENMT: Mucous membranes are moist. Posterior pharynx clear of any exudate or lesions.Normal dentition.  Neck: normal, supple, no masses, no thyromegaly Respiratory: clear to auscultation bilaterally, no wheezing, no crackles. Normal respiratory effort. No accessory muscle use.  Cardiovascular: Regular rate and rhythm, no murmurs / rubs / gallops. No extremity edema. 2+ pedal pulses. No carotid bruits.  Abdomen: no tenderness, no masses palpated. No hepatosplenomegaly. Bowel sounds positive.  Musculoskeletal: no clubbing / cyanosis. No joint deformity upper and lower extremities. Good ROM, no contractures. Normal muscle tone.  Skin: no rashes, lesions, ulcers. No induration Neurologic: CN 2-12 grossly intact. Sensation intact, DTR normal. Strength 5/5 in all 4.  Psychiatric: Normal judgment and insight. Alert and oriented x 3. Normal mood.   Labs on Admission: I have personally reviewed following labs and imaging studies  CBC: Recent Labs  Lab 12/21/17 0845 12/21/17 0855  WBC 7.0  --   NEUTROABS 4.2  --   HGB 13.7 14.6  HCT 42.4 43.0  MCV 97.2  --   PLT 250  --    Basic Metabolic Panel: Recent Labs  Lab 12/21/17 0845 12/21/17 0855  NA 140 142  K 4.0 4.1  CL 105 104  CO2 26  --   GLUCOSE 115* 114*  BUN 14 14  CREATININE 0.93 1.00  CALCIUM 9.5  --    Liver Function Tests: Recent Labs  Lab 12/21/17 0845  AST 20  ALT 20  ALKPHOS 99  BILITOT 1.0  PROT 7.5  ALBUMIN 3.9   Coagulation Profile: Recent Labs  Lab 12/21/17 0845  INR 0.95   Urine analysis:    Component Value Date/Time   COLORURINE YELLOW 12/21/2017 0725   APPEARANCEUR HAZY (A) 12/21/2017 0725   LABSPEC 1.017 12/21/2017 0725   PHURINE 5.0 12/21/2017 0725   GLUCOSEU NEGATIVE 12/21/2017 0725   HGBUR SMALL (A) 12/21/2017 0725   BILIRUBINUR NEGATIVE 12/21/2017 0725   KETONESUR NEGATIVE 12/21/2017 0725   PROTEINUR NEGATIVE 12/21/2017 0725   UROBILINOGEN 0.2 05/27/2010 1050    NITRITE NEGATIVE 12/21/2017 0725   LEUKOCYTESUR MODERATE (A) 12/21/2017 0725    Recent Results (from the past 240 hour(s))  Wet prep, genital     Status: Abnormal   Collection Time: 12/21/17  9:09 AM  Result Value Ref Range Status   Yeast Wet Prep HPF POC NONE SEEN NONE SEEN Final   Trich, Wet Prep NONE SEEN NONE SEEN Final   Clue Cells Wet Prep HPF POC NONE SEEN NONE SEEN Final   WBC, Wet Prep HPF POC MANY (A) NONE SEEN Final   Sperm NONE SEEN  Final    Radiological Exams on Admission: Ct Head Wo Contrast Result Date: 12/21/2017 1. No acute intracranial abnormality. Stable mild cerebral atrophy.   Ct Abdomen Pelvis W Contrast Result Date: 12/21/2017 No acute findings in the abdomen or  pelvis. Aortoiliac atherosclerosis.   EKG: pending   Assessment/Plan Active Problems:   Weakness, RUE, right facial numbness and weakness, slurred speech - concern for stroke - will place stroke order set - transfer to [facility] - follow up on MRI/MRA, ECHO, carotid doppler - A1C, lipid panel requested - will be NPO until swallow screen done - PT/OT eval also requested  - pt on aspirin and statin at home  - will consult with neurology     UTI - continue Rocephin  - follow up on urine culture     HTN - continue Amlodipine - Benazepril, Atenolol     HLD - cont home medical regimen    Hypothyroidism  - continue Synthroid    DVT prophylaxis: Lovenox SQ Code Status: Full  Family Communication: Pt and family updated at bedside Disposition Plan: transfer to [facility] Consults called: neurology  Admission status: inpatient   Debbora Presto MD [facility] Pager [phone]  If 7PM-7AM, please contact night-coverage [email or web address] Password [facility]  12/21/2017, 12:17 PM

## 2017-12-21 NOTE — ED Notes (Signed)
JONNA NT COLLECTED COMPLETE BOOL PANEL ON THIS PT EARLIER. WILL CALL LAB TO SEE IF BLOOD COLLECTED BE ENOUGH FOR BLOOD RECENTLY ORDERED ON ADMISSION

## 2017-12-21 NOTE — ED Notes (Signed)
[facility] CALLED FOR TRANSPORT. [facility] RESPONDED

## 2017-12-21 NOTE — ED Notes (Signed)
ED TO INPATIENT HANDOFF REPORT  Name/Age/Gender Kimberly Watts 57 y.o. female  Code Status    Code Status Orders  (From admission, onward)        Start     Ordered   12/21/17 1548  Full code  Continuous     12/21/17 1547    Code Status History    Date Active Date Inactive Code Status Order ID Comments User Context   This patient has a current code status but no historical code status.      Home/SNF/Other Home  Chief Complaint Slurred Speech; Numbness (L);Abdominal Pain  Level of Care/Admitting Diagnosis ED Disposition    ED Disposition Condition [facility] Area: [place] [[id number]]  Level of Care: Telemetry [5]  Diagnosis: Weakness [768115]  Admitting Physician: Maryruth Hancock  Attending Physician: Theodis Blaze [[id number]]  Estimated length of stay: 3 - 4 days  Certification:: I certify this patient will need inpatient services for at least 2 midnights  PT Class (Do Not Modify): Inpatient [101]  PT Acc Code (Do Not Modify): Private [1]       Medical History Past Medical History:  Diagnosis Date  . GERD (gastroesophageal reflux disease)   . High cholesterol   . Hypertension   . Thyroid disease     Allergies No Known Allergies  IV Location/Drains/Wounds Patient Lines/Drains/Airways Status   Active Line/Drains/Airways    Name:   Placement date:   Placement time:   Site:   Days:   Peripheral IV 12/21/17 Left Antecubital   12/21/17    0929    Antecubital   less than 1   Peripheral IV 12/21/17 Left Hand   12/21/17    1427    Hand   less than 1          Labs/Imaging Results for orders placed or performed during the hospital encounter of 12/21/17 (from the past 48 hour(s))  Urine rapid drug screen (hosp performed)     Status: None   Collection Time: 12/21/17  7:20 AM  Result Value Ref Range   Opiates NONE DETECTED NONE DETECTED   Cocaine NONE DETECTED NONE DETECTED   Benzodiazepines NONE DETECTED NONE DETECTED    Amphetamines NONE DETECTED NONE DETECTED   Tetrahydrocannabinol NONE DETECTED NONE DETECTED   Barbiturates NONE DETECTED NONE DETECTED    Comment: (NOTE) DRUG SCREEN FOR MEDICAL PURPOSES ONLY.  IF CONFIRMATION IS NEEDED FOR ANY PURPOSE, NOTIFY LAB WITHIN 5 DAYS. LOWEST DETECTABLE LIMITS FOR URINE DRUG SCREEN Drug Class                     Cutoff (ng/mL) Amphetamine and metabolites    1000 Barbiturate and metabolites    200 Benzodiazepine                 726 Tricyclics and metabolites     300 Opiates and metabolites        300 Cocaine and metabolites        300 THC                            50   Urinalysis, Routine w reflex microscopic     Status: Abnormal   Collection Time: 12/21/17  7:25 AM  Result Value Ref Range   Color, Urine YELLOW YELLOW   APPearance HAZY (A) CLEAR   Specific Gravity, Urine 1.017 1.005 - 1.030  pH 5.0 5.0 - 8.0   Glucose, UA NEGATIVE NEGATIVE mg/dL   Hgb urine dipstick SMALL (A) NEGATIVE   Bilirubin Urine NEGATIVE NEGATIVE   Ketones, ur NEGATIVE NEGATIVE mg/dL   Protein, ur NEGATIVE NEGATIVE mg/dL   Nitrite NEGATIVE NEGATIVE   Leukocytes, UA MODERATE (A) NEGATIVE   RBC / HPF 6-30 0 - 5 RBC/hpf   WBC, UA TOO NUMEROUS TO COUNT 0 - 5 WBC/hpf   Bacteria, UA RARE (A) NONE SEEN   Squamous Epithelial / LPF 6-30 (A) NONE SEEN   Mucus PRESENT    Non Squamous Epithelial 0-5 (A) NONE SEEN  Ethanol     Status: None   Collection Time: 12/21/17  8:45 AM  Result Value Ref Range   Alcohol, Ethyl (B) <10 <10 mg/dL    Comment:        LOWEST DETECTABLE LIMIT FOR SERUM ALCOHOL IS 10 mg/dL FOR MEDICAL PURPOSES ONLY   Protime-INR     Status: None   Collection Time: 12/21/17  8:45 AM  Result Value Ref Range   Prothrombin Time 12.6 11.4 - 15.2 seconds   INR 0.95   APTT     Status: None   Collection Time: 12/21/17  8:45 AM  Result Value Ref Range   aPTT 28 24 - 36 seconds  CBC     Status: None   Collection Time: 12/21/17  8:45 AM  Result Value Ref Range    WBC 7.0 4.0 - 10.5 K/uL   RBC 4.36 3.87 - 5.11 MIL/uL   Hemoglobin 13.7 12.0 - 15.0 g/dL   HCT 42.4 36.0 - 46.0 %   MCV 97.2 78.0 - 100.0 fL   MCH 31.4 26.0 - 34.0 pg   MCHC 32.3 30.0 - 36.0 g/dL   RDW 13.3 11.5 - 15.5 %   Platelets 250 150 - 400 K/uL  Differential     Status: None   Collection Time: 12/21/17  8:45 AM  Result Value Ref Range   Neutrophils Relative % 59 %   Neutro Abs 4.2 1.7 - 7.7 K/uL   Lymphocytes Relative 31 %   Lymphs Abs 2.2 0.7 - 4.0 K/uL   Monocytes Relative 8 %   Monocytes Absolute 0.5 0.1 - 1.0 K/uL   Eosinophils Relative 2 %   Eosinophils Absolute 0.1 0.0 - 0.7 K/uL   Basophils Relative 0 %   Basophils Absolute 0.0 0.0 - 0.1 K/uL  Comprehensive metabolic panel     Status: Abnormal   Collection Time: 12/21/17  8:45 AM  Result Value Ref Range   Sodium 140 135 - 145 mmol/L   Potassium 4.0 3.5 - 5.1 mmol/L   Chloride 105 101 - 111 mmol/L   CO2 26 22 - 32 mmol/L   Glucose, Bld 115 (H) 65 - 99 mg/dL   BUN 14 6 - 20 mg/dL   Creatinine, Ser 0.93 0.44 - 1.00 mg/dL   Calcium 9.5 8.9 - 10.3 mg/dL   Total Protein 7.5 6.5 - 8.1 g/dL   Albumin 3.9 3.5 - 5.0 g/dL   AST 20 15 - 41 U/L   ALT 20 14 - 54 U/L   Alkaline Phosphatase 99 38 - 126 U/L   Total Bilirubin 1.0 0.3 - 1.2 mg/dL   GFR calc non Af Amer >60 >60 mL/min   GFR calc Af Amer >60 >60 mL/min    Comment: (NOTE) The eGFR has been calculated using the CKD EPI equation. This calculation has not been validated in all clinical situations.  eGFR's persistently <60 mL/min signify possible Chronic Kidney Disease.    Anion gap 9 5 - 15  I-stat troponin, ED     Status: None   Collection Time: 12/21/17  8:52 AM  Result Value Ref Range   Troponin i, poc 0.00 0.00 - 0.08 ng/mL   Comment 3            Comment: Due to the release kinetics of cTnI, a negative result within the first hours of the onset of symptoms does not rule out myocardial infarction with certainty. If myocardial infarction is still  suspected, repeat the test at appropriate intervals.   I-Stat beta hCG blood, ED     Status: None   Collection Time: 12/21/17  8:53 AM  Result Value Ref Range   I-stat hCG, quantitative <5.0 <5 mIU/mL   Comment 3            Comment:   GEST. AGE      CONC.  (mIU/mL)   <=1 WEEK        5 - 50     2 WEEKS       50 - 500     3 WEEKS       100 - 10,000     4 WEEKS     1,000 - 30,000        FEMALE AND NON-PREGNANT FEMALE:     LESS THAN 5 mIU/mL   I-Stat Chem 8, ED     Status: Abnormal   Collection Time: 12/21/17  8:55 AM  Result Value Ref Range   Sodium 142 135 - 145 mmol/L   Potassium 4.1 3.5 - 5.1 mmol/L   Chloride 104 101 - 111 mmol/L   BUN 14 6 - 20 mg/dL   Creatinine, Ser 1.00 0.44 - 1.00 mg/dL   Glucose, Bld 114 (H) 65 - 99 mg/dL   Calcium, Ion 1.21 1.15 - 1.40 mmol/L   TCO2 27 22 - 32 mmol/L   Hemoglobin 14.6 12.0 - 15.0 g/dL   HCT 43.0 36.0 - 46.0 %  Wet prep, genital     Status: Abnormal   Collection Time: 12/21/17  9:09 AM  Result Value Ref Range   Yeast Wet Prep HPF POC NONE SEEN NONE SEEN   Trich, Wet Prep NONE SEEN NONE SEEN   Clue Cells Wet Prep HPF POC NONE SEEN NONE SEEN   WBC, Wet Prep HPF POC MANY (A) NONE SEEN   Sperm NONE SEEN    Ct Head Wo Contrast  Result Date: 12/21/2017 CLINICAL DATA:  Slurred speech and difficulty with words this morning. EXAM: CT HEAD WITHOUT CONTRAST TECHNIQUE: Contiguous axial images were obtained from the base of the skull through the vertex without intravenous contrast. COMPARISON:  CT head dated September 12, 2014. FINDINGS: Brain: No evidence of acute infarction, hemorrhage, hydrocephalus, extra-axial collection or mass lesion/mass effect. Stable mild cortical atrophy. Vascular: No hyperdense vessel or unexpected calcification. Skull: Normal. Negative for fracture or focal lesion. Sinuses/Orbits: No acute finding. Other: None. IMPRESSION: 1. No acute intracranial abnormality. Stable mild cerebral atrophy. Electronically Signed   By:  Titus Dubin M.D.   On: 12/21/2017 09:53   Ct Abdomen Pelvis W Contrast  Result Date: 12/21/2017 CLINICAL DATA:  Mid to lower abdominal pain for 1 week. EXAM: CT ABDOMEN AND PELVIS WITH CONTRAST TECHNIQUE: Multidetector CT imaging of the abdomen and pelvis was performed using the standard protocol following bolus administration of intravenous contrast. CONTRAST:  121m ISOVUE-300 IOPAMIDOL (ISOVUE-300) INJECTION  61% COMPARISON:  10/10/2016 FINDINGS: Lower chest: Lung bases are clear. No effusions. Heart is normal size. Hepatobiliary: Benign-appearing cyst anteriorly in the right hepatic lobe is stable. Gallbladder unremarkable. Pancreas: No focal abnormality or ductal dilatation. Spleen: Punctate calcification in the spleen compatible with old granulomatous disease. Normal size. Adrenals/Urinary Tract: No adrenal abnormality. No focal renal abnormality. No stones or hydronephrosis. Urinary bladder is unremarkable. Stomach/Bowel: Stomach, large and small bowel grossly unremarkable. No evidence of diverticulitis. Appendix is normal. Vascular/Lymphatic: No evidence of aneurysm or adenopathy. Aortic and iliac calcifications. Reproductive: Uterus and adnexa unremarkable.  No mass. Other: No free fluid or free air. Musculoskeletal: No acute bony abnormality. IMPRESSION: No acute findings in the abdomen or pelvis. Aortoiliac atherosclerosis. Electronically Signed   By: Rolm Baptise M.D.   On: 12/21/2017 09:54    Pending Labs Unresulted Labs (From admission, onward)   Start     Ordered   12/28/17 0500  Creatinine, serum  (enoxaparin (LOVENOX)    CrCl >/= 30 ml/min)  Weekly,   R    Comments:  while on enoxaparin therapy    12/21/17 1547   12/22/17 0500  Hemoglobin A1c  Tomorrow morning,   R     12/21/17 1547   12/22/17 0500  Lipid panel  Tomorrow morning,   R    Comments:  Fasting    12/21/17 1547   12/21/17 1548  HIV antibody (Routine Testing)  Once,   R     12/21/17 1547   12/21/17 1548  CBC   (enoxaparin (LOVENOX)    CrCl >/= 30 ml/min)  Once,   R    Comments:  Baseline for enoxaparin therapy IF NOT ALREADY DRAWN.  Notify MD if PLT < 100 K.    12/21/17 1547   12/21/17 1548  Creatinine, serum  (enoxaparin (LOVENOX)    CrCl >/= 30 ml/min)  Once,   R    Comments:  Baseline for enoxaparin therapy IF NOT ALREADY DRAWN.    12/21/17 1547   12/21/17 0849  Urine culture  STAT,   STAT     12/21/17 0848      Vitals/Pain Today's Vitals   12/21/17 1530 12/21/17 1600 12/21/17 1653 12/21/17 1700  BP: (!) 154/93  140/78 (!) 146/72  Pulse: (!) 57  (!) 56 68  Resp: '12  15 14  ' Temp:      TempSrc:      SpO2:  98% 98%   Weight:      Height:      PainSc:        Isolation Precautions No active isolations  Medications Medications  iopamidol (ISOVUE-300) 61 % injection (not administered)  aspirin chewable tablet 81 mg (not administered)  atenolol (TENORMIN) tablet 25 mg (not administered)  calcium-vitamin D (OSCAL WITH D) 500-200 MG-UNIT per tablet 1 tablet (not administered)  dicyclomine (BENTYL) tablet 20 mg (not administered)  levothyroxine (SYNTHROID, LEVOTHROID) tablet 50 mcg (not administered)  meclizine (ANTIVERT) tablet 25 mg (not administered)  pantoprazole (PROTONIX) EC tablet 40 mg (not administered)  atorvastatin (LIPITOR) tablet 40 mg (not administered)  acetaminophen (TYLENOL) tablet 650 mg (not administered)    Or  acetaminophen (TYLENOL) solution 650 mg (not administered)    Or  acetaminophen (TYLENOL) suppository 650 mg (not administered)  enoxaparin (LOVENOX) injection 40 mg (not administered)  hydrALAZINE (APRESOLINE) injection 5 mg (not administered)  cefTRIAXone (ROCEPHIN) 2 g in dextrose 5 % 50 mL IVPB (not administered)  HYDROcodone-acetaminophen (NORCO/VICODIN) 5-325 MG per tablet 1 tablet (1  tablet Oral Given 12/21/17 1600)  amLODipine (NORVASC) tablet 5 mg (not administered)    And  benazepril (LOTENSIN) tablet 10 mg (not administered)  morphine 4 MG/ML  injection 4 mg (4 mg Intravenous Given 12/21/17 1010)  ondansetron (ZOFRAN) injection 4 mg (4 mg Intravenous Given 12/21/17 0955)  aspirin chewable tablet 324 mg (324 mg Oral Given 12/21/17 0955)  iopamidol (ISOVUE-300) 61 % injection 100 mL (100 mLs Intravenous Contrast Given 12/21/17 0930)   stroke: mapping our early stages of recovery book ( Does not apply Given 12/21/17 1600)  cefTRIAXone (ROCEPHIN) 2 g in dextrose 5 % 50 mL IVPB (0 g Intravenous Stopped 12/21/17 1330)    Mobility walks

## 2017-12-21 NOTE — Progress Notes (Signed)
[facility] A&ox4. Only complaints are lower abdominal pain, not requesting medication for pain at this time. VSS. BP 133/63 (BP Location: Right Arm)   Pulse (!) 56   Temp 98.2 F (36.8 C) (Oral)   Resp 16   Ht 5\' 5"  (1.651 m)   Wt 123.8 kg (273 lb)   SpO2 98%   BMI 45.43 kg/m

## 2017-12-21 NOTE — ED Triage Notes (Signed)
Pt states she woke up this morning feeling dizzy, felt like her heart was beating too fast, slurred speech, and right side felt numb, and blurred vision  Pt also c/o abd pain  Denies N/V  Pt has no neuro deficits in triage  Speech clear

## 2017-12-21 NOTE — Progress Notes (Signed)
Pharmacy Antibiotic Note  Kern ReapLoretha J Froberg is a 57 y.o. female admitted on 12/21/2017 with UTI.  Pharmacy has been consulted for Ceftriaxone dosing.  Plan: Ceftriaxone 2g IV q24h (larger dose due to obesity). Ceftriaxone does not require renal or hepatic adjustment, so pharmacy will sign off at this time. Please re-consult if needed.   Height: 5\' 5"  (165.1 cm) Weight: 273 lb (123.8 kg) IBW/kg (Calculated) : 57  Temp (24hrs), Avg:97.8 F (36.6 C), Min:97.8 F (36.6 C), Max:97.8 F (36.6 C)  Recent Labs  Lab 12/21/17 0845 12/21/17 0855  WBC 7.0  --   CREATININE 0.93 1.00    Estimated Creatinine Clearance: 82 mL/min (by C-G formula based on SCr of 1 mg/dL).    No Known Allergies  Antimicrobials this admission: 1/7 >> Ceftriaxone >>  Dose adjustments this admission: --  Microbiology results: 1/7 UCx: sent   Thank you for allowing pharmacy to be a part of this patient's care.   Greer PickerelJigna Gerica Koble, PharmD, BCPS Pager: [phone] 12/21/2017 12:30 PM

## 2017-12-21 NOTE — ED Notes (Signed)
Telestroke assessment started.

## 2017-12-21 NOTE — ED Provider Notes (Signed)
[facility]-EMERGENCY DEPT Provider Note   CSN: [id number] Arrival date & time: 12/21/17  0346     History   Chief Complaint Chief Complaint  Patient presents with  . Dizziness  . Abdominal Pain     HPI   Blood pressure (!) 142/94, pulse 74, temperature 97.8 F (36.6 C), temperature source Oral, resp. rate 17, SpO2 97 %.  Kimberly Watts is a 56 y.o. female complaining of intermittent lower abdominal pain over the course of last week, states that the burning with no associated dysuria, hematuria, nausea vomiting diarrhea, melena, hematochezia.  She was seen by PCP who prescribed antibiotic for presumed UTI which she is completed with little relief.  Of note.  Patient was woke at 3 AM this morning secondary to abdominal pain.  She states at that time she had slurred speech, but she felt like she was being pulled to the right when she walked in her right hand was contracted.  She called to her daughter for help.  The symptoms lasted approximately 15 minutes and then resolve spontaneously.  She normally takes low-dose aspirin, she did have that this morning.  She states that she has a pins and needles paresthesia on the right side of her face which is the only persisting symptom.  She does not smoke cigarettes, mother had a stroke, father died of a massive heart attack at 13.  She is compliant with her hypertension, high cholesterol and thyroid medications.  She endorses headache on review of systems.  Past Medical History:  Diagnosis Date  . GERD (gastroesophageal reflux disease)   . High cholesterol   . Hypertension   . Thyroid disease     Patient Active Problem List   Diagnosis Date Noted  . Weakness 12/21/2017  . ACUTE PANCREATITIS 08/23/2009    Past Surgical History:  Procedure Laterality Date  . BREAST REDUCTION SURGERY      OB History    No data available       Home Medications    Prior to Admission medications   Medication Sig Start Date  End Date Taking? Authorizing Provider  amLODipine-benazepril (LOTREL) 5-10 MG per capsule Take 1 capsule by mouth daily.   Yes [provider]  aspirin 81 MG tablet Take 81 mg by mouth daily.   Yes [provider]  atenolol (TENORMIN) 25 MG tablet Take 25 mg by mouth daily.   Yes [provider]  calcium-vitamin D (OSCAL WITH D) 500-200 MG-UNIT per tablet Take 1 tablet by mouth daily.   Yes [provider]  dicyclomine (BENTYL) 20 MG tablet Take 20 mg by mouth every 6 (six) hours as needed. Abdominal cramps 12/18/17  Yes [provider]  levothyroxine (SYNTHROID, LEVOTHROID) 50 MCG tablet Take 50 mcg by mouth at bedtime. 10/11/17  Yes [provider]  meclizine (ANTIVERT) 25 MG tablet Take 1 tablet (25 mg total) by mouth every 6 (six) hours as needed for dizziness. 09/12/14  Yes Pricilla Loveless, MD  omeprazole (PRILOSEC) 20 MG capsule Take 20 mg by mouth daily.   Yes [provider]  simvastatin (ZOCOR) 80 MG tablet Take 80 mg by mouth at bedtime.   Yes [provider]  fluticasone (FLONASE) 50 MCG/ACT nasal spray Place 1-2 sprays into the nose daily. Patient not taking: Reported on 12/21/2017 05/12/13   Garlon Hatchet, PA-C    Family History Family History  Problem Relation Age of Onset  . Stroke Mother   . Diabetes Mother   .  Hypertension Mother   . Diabetes Father   . Hypertension Father   . Heart attack Father     Social History Social History   Tobacco Use  . Smoking status: Never Smoker  . Smokeless tobacco: Never Used  Substance Use Topics  . Alcohol use: No  . Drug use: No     Allergies   Patient has no known allergies.   Review of Systems Review of Systems  A complete review of systems was obtained and all systems are negative except as noted in the HPI and PMH.    Physical Exam Updated Vital Signs BP (!) 170/90   Pulse (!) 56   Temp 97.8 F (36.6 C)   Resp 15   Ht 5\' 5"  (1.651 m)   Wt  123.8 kg (273 lb)   SpO2 98%   BMI 45.43 kg/m   Physical Exam  Constitutional: She is oriented to person, place, and time. She appears well-developed and well-nourished.  HENT:  Head: Normocephalic and atraumatic.  Mouth/Throat: Oropharynx is clear and moist.  Eyes: Conjunctivae and EOM are normal. Pupils are equal, round, and reactive to light.  Neck: Normal range of motion. Neck supple.  FROM to C-spine. Pt can touch chin to chest without discomfort. No TTP of midline cervical spine.   Cardiovascular: Normal rate, regular rhythm and intact distal pulses.  Pulmonary/Chest: Effort normal and breath sounds normal. No respiratory distress. She has no wheezes. She has no rales. She exhibits no tenderness.  Abdominal: Soft. Bowel sounds are normal. Hepatosplenomegaly: .npmdm. There is no tenderness.  Genitourinary:  Genitourinary Comments: Exam chaperoned by Technician: No rashes or lesions, the cervix is friable, no abnormal vaginal discharge, no cervical motion or adnexal tenderness.  Musculoskeletal: Normal range of motion. She exhibits no edema or tenderness.  Neurological: She is alert and oriented to person, place, and time. No cranial nerve deficit.  II-Visual fields grossly intact. III/IV/VI-Extraocular movements intact.  Pupils reactive bilaterally. V/VII-Smile symmetric, equal eyebrow raise,  facial sensation intact VIII- Hearing grossly intact IX/X-Normal gag XI-bilateral shoulder shrug XII-midline tongue extension Motor: 5/5 bilaterally with normal tone and bulk Cerebellar: Normal finger-to-nose  and normal heel-to-shin test.   Romberg negative Ambulates with a coordinated gait   Nursing note and vitals reviewed.    ED Treatments / Results  Labs (all labs ordered are listed, but only abnormal results are displayed) Labs Reviewed  WET PREP, GENITAL - Abnormal; Notable for the following components:      Result Value   WBC, Wet Prep HPF POC MANY (*)    All other  components within normal limits  COMPREHENSIVE METABOLIC PANEL - Abnormal; Notable for the following components:   Glucose, Bld 115 (*)    All other components within normal limits  URINALYSIS, ROUTINE W REFLEX MICROSCOPIC - Abnormal; Notable for the following components:   APPearance HAZY (*)    Hgb urine dipstick SMALL (*)    Leukocytes, UA MODERATE (*)    Bacteria, UA RARE (*)    Squamous Epithelial / LPF 6-30 (*)    Non Squamous Epithelial 0-5 (*)    All other components within normal limits  I-STAT CHEM 8, ED - Abnormal; Notable for the following components:   Glucose, Bld 114 (*)    All other components within normal limits  URINE CULTURE  ETHANOL  PROTIME-INR  APTT  CBC  DIFFERENTIAL  RAPID URINE DRUG SCREEN, HOSP PERFORMED  I-STAT TROPONIN, ED  I-STAT BETA HCG BLOOD, ED ([facility], [facility], [facility]  ONLY)  GC/CHLAMYDIA PROBE AMP ([place]) NOT AT [facility]    EKG  EKG Interpretation  Date/Time:  Monday December 21 2017 03:58:29 EST Ventricular Rate:  66 PR Interval:    QRS Duration: 92 QT Interval:  411 QTC Calculation: 431 R Axis:   -5 Text Interpretation:  Sinus rhythm LVH by voltage No significant change was found Reconfirmed by Derwood Kaplan [id number]) on 12/21/2017 7:29:31 AM       Radiology Ct Head Wo Contrast  Result Date: 12/21/2017 CLINICAL DATA:  Slurred speech and difficulty with words this morning. EXAM: CT HEAD WITHOUT CONTRAST TECHNIQUE: Contiguous axial images were obtained from the base of the skull through the vertex without intravenous contrast. COMPARISON:  CT head dated September 12, 2014. FINDINGS: Brain: No evidence of acute infarction, hemorrhage, hydrocephalus, extra-axial collection or mass lesion/mass effect. Stable mild cortical atrophy. Vascular: No hyperdense vessel or unexpected calcification. Skull: Normal. Negative for fracture or focal lesion. Sinuses/Orbits: No acute finding. Other: None. IMPRESSION: 1. No acute intracranial abnormality. Stable mild  cerebral atrophy. Electronically Signed   By: Obie Dredge M.D.   On: 12/21/2017 09:53   Ct Abdomen Pelvis W Contrast  Result Date: 12/21/2017 CLINICAL DATA:  Mid to lower abdominal pain for 1 week. EXAM: CT ABDOMEN AND PELVIS WITH CONTRAST TECHNIQUE: Multidetector CT imaging of the abdomen and pelvis was performed using the standard protocol following bolus administration of intravenous contrast. CONTRAST:  ISOVUE-300 IOPAMIDOL (ISOVUE-300) INJECTION 61% COMPARISON:  10/10/2016 FINDINGS: Lower chest: Lung bases are clear. No effusions. Heart is normal size. Hepatobiliary: Benign-appearing cyst anteriorly in the right hepatic lobe is stable. Gallbladder unremarkable. Pancreas: No focal abnormality or ductal dilatation. Spleen: Punctate calcification in the spleen compatible with old granulomatous disease. Normal size. Adrenals/Urinary Tract: No adrenal abnormality. No focal renal abnormality. No stones or hydronephrosis. Urinary bladder is unremarkable. Stomach/Bowel: Stomach, large and small bowel grossly unremarkable. No evidence of diverticulitis. Appendix is normal. Vascular/Lymphatic: No evidence of aneurysm or adenopathy. Aortic and iliac calcifications. Reproductive: Uterus and adnexa unremarkable.  No mass. Other: No free fluid or free air. Musculoskeletal: No acute bony abnormality. IMPRESSION: No acute findings in the abdomen or pelvis. Aortoiliac atherosclerosis. Electronically Signed   By: Charlett Nose M.D.   On: 12/21/2017 09:54    Procedures Procedures (including critical care time)  Medications Ordered in ED Medications  iopamidol (ISOVUE-300) 61 % injection (not administered)  cefTRIAXone (ROCEPHIN) 1 g in dextrose 5 % 50 mL IVPB (not administered)  morphine 4 MG/ML injection 4 mg (4 mg Intravenous Given 12/21/17 1010)  ondansetron (ZOFRAN) injection 4 mg (4 mg Intravenous Given 12/21/17 0955)  aspirin chewable tablet 324 mg (324 mg Oral Given 12/21/17 0955)  iopamidol (ISOVUE-300)  61 % injection 100 mL (100 mLs Intravenous Contrast Given 12/21/17 0930)     Initial Impression / Assessment and Plan / ED Course  I have reviewed the triage vital signs and the nursing notes.  Pertinent labs & imaging results that were available during my care of the patient were reviewed by me and considered in my medical decision making (see chart for details).     Vitals:   12/21/17 0355 12/21/17 0834 12/21/17 0954 12/21/17 1145  BP: (!) 142/94 (!) 170/90    Pulse: 74 (!) 56    Resp: 17 15    Temp: 97.8 F (36.6 C)  97.8 F (36.6 C)   TempSrc: Oral     SpO2: 97% 98%    Weight:    123.8  kg (273 lb)  Height:    5\' 5"  (1.651 m)    Medications  iopamidol (ISOVUE-300) 61 % injection (not administered)  cefTRIAXone (ROCEPHIN) 1 g in dextrose 5 % 50 mL IVPB (not administered)  morphine 4 MG/ML injection 4 mg (4 mg Intravenous Given 12/21/17 1010)  ondansetron (ZOFRAN) injection 4 mg (4 mg Intravenous Given 12/21/17 0955)  aspirin chewable tablet 324 mg (324 mg Oral Given 12/21/17 0955)  iopamidol (ISOVUE-300) 61 % injection 100 mL (100 mLs Intravenous Contrast Given 12/21/17 0930)    DEMITA TOBIA is 57 y.o. female presenting with lower abdominal pain which she has had over the course of a week with no fever, chills, nausea, vomiting, change in bowel or bladder habits.  She was treated for UTI without alleviation of her symptoms.  Of concern she states that she had an episode at 3 AM where she felt like she had slurred speech, was walking towards the right and had a right sided reduced sensation.  The symptoms have largely resolved except for the reduced sensation.  Given this patient's obesity, hyperlipidemia, hypertension commonly, family history of stroke and ACS will need admission for TIA workup.  Outside of the window and low NIH stroke scale, this patient would not be a TPA candidate.  Full dose aspirin given in the ED.  Morphine and Zofran for pain control.  Abdominal exam  nonsurgical.  Blood work reassuring, urinalysis with too numerous to count white blood cells with moderate leukocytes and negativeNitrites with rare bacteria.  Urine culture pending, patient is given Rocephin, pelvic exam reassuring.   Gust with [facility] hospitalist Dr. Izola Price who is concerned that this patient may have a cerebellar lacunar stroke and would feel more comfortable with transfer to [facility] and MRI.  Neuro hospitalist consult from Dr. Amada Jupiter appreciated who would like this patient transferred to [facility].  He states that the emergent MRI can be canceled and this patient can have further imaging as an inpatient.  Final Clinical Impressions(s) / ED Diagnoses   Final diagnoses:  TIA (transient ischemic attack)  Acute cystitis without hematuria    ED Discharge Orders    None       Kizzie Cotten, Mardella Layman 12/21/17 1200    Derwood Kaplan, MD 12/22/17 [phone]

## 2017-12-21 NOTE — ED Notes (Signed)
Patient transported to CT 

## 2017-12-21 NOTE — ED Notes (Signed)
STROKE HANDOUT GIVEN TO PT

## 2017-12-21 NOTE — ED Notes (Signed)
MRI CALLED QUESTION PT'S SIZE. MRI WILL BE BETWEEN 2PM -6PM.

## 2017-12-21 NOTE — ED Notes (Signed)
[facility] RN AWARE OF MRI PENDING

## 2017-12-22 ENCOUNTER — Inpatient Hospital Stay (HOSPITAL_BASED_OUTPATIENT_CLINIC_OR_DEPARTMENT_OTHER): Payer: Managed Care, Other (non HMO)

## 2017-12-22 ENCOUNTER — Other Ambulatory Visit (HOSPITAL_COMMUNITY): Payer: Managed Care, Other (non HMO)

## 2017-12-22 DIAGNOSIS — N3000 Acute cystitis without hematuria: Secondary | ICD-10-CM

## 2017-12-22 DIAGNOSIS — I10 Essential (primary) hypertension: Secondary | ICD-10-CM | POA: Diagnosis present

## 2017-12-22 DIAGNOSIS — E039 Hypothyroidism, unspecified: Secondary | ICD-10-CM | POA: Diagnosis not present

## 2017-12-22 DIAGNOSIS — I361 Nonrheumatic tricuspid (valve) insufficiency: Secondary | ICD-10-CM

## 2017-12-22 DIAGNOSIS — G459 Transient cerebral ischemic attack, unspecified: Secondary | ICD-10-CM

## 2017-12-22 LAB — URINE CULTURE

## 2017-12-22 LAB — LIPID PANEL
CHOLESTEROL: 192 mg/dL (ref 0–200)
HDL: 56 mg/dL (ref >40)
LDL Cholesterol: 115 mg/dL — ABNORMAL HIGH (ref 0–99)
TRIGLYCERIDES: 107 mg/dL (ref <150)
Total CHOL/HDL Ratio: 3.4 RATIO
VLDL: 21 mg/dL (ref 0–40)

## 2017-12-22 LAB — GC/CHLAMYDIA PROBE AMP (~~LOC~~) NOT AT ARMC
Chlamydia: NEGATIVE
Neisseria Gonorrhea: NEGATIVE

## 2017-12-22 LAB — ECHOCARDIOGRAM COMPLETE
HEIGHTINCHES: 65 in
Weight: 4368 oz

## 2017-12-22 LAB — HEMOGLOBIN A1C
Hgb A1c MFr Bld: 5.9 % — ABNORMAL HIGH (ref 4.8–5.6)
Mean Plasma Glucose: 122.63 mg/dL

## 2017-12-22 MED ORDER — ATORVASTATIN CALCIUM 40 MG PO TABS: 40.0000 mg | ORAL_TABLET | Freq: Every day | ORAL | 3 refills | Status: DC

## 2017-12-22 MED ORDER — CLOPIDOGREL BISULFATE 75 MG PO TABS: 75.0000 mg | ORAL_TABLET | Freq: Every day | ORAL | 3 refills | Status: AC

## 2017-12-22 MED ORDER — CEFUROXIME AXETIL 500 MG PO TABS: 500.0000 mg | ORAL_TABLET | Freq: Two times a day (BID) | ORAL | 0 refills | Status: DC

## 2017-12-22 MED ORDER — CLOPIDOGREL BISULFATE 75 MG PO TABS
75.0000 mg | ORAL_TABLET | Freq: Every day | ORAL | Status: DC
  Administered 2017-12-22: 75 mg via ORAL
  Filled 2017-12-22: qty 1

## 2017-12-22 NOTE — Evaluation (Signed)
Physical Therapy Evaluation Patient Details Name: Kimberly Watts Kensinger MRN: [id number] DOB: 01/13/1961 Today's Date: 12/22/2017   History of Present Illness  Kimberly Watts Noone is a 57 y.o. female with medical history significant of HTN, hypothyroidism, presented to [facility] ED with main concern of several days duration of progressively worsening lower quadrants abd pain that was mostly intermittent and throbbing, 5/10 in severity, not radiating, associated with dysuria. She has seen her PCP and was given prescription for ABX but says her symptoms have not improved. Patient also reports waking up at night time feeling dizzy, had one short episodes of slurred speech and felt somewhat week on the right side, arm > leg. Her symptoms are now mostly resolved but she says she still has some right facial numbness and right upper extremity weakness.   Clinical Impression  Pt admitted with above. R UE, facial droop and abdominal pain seem to have all subsided. Pt however with onset of lightheadedness upon standing and ambulation. BP was 139/101, and then 147/97 in standing and during ambulation. This lightheadedness dissipates in sitting but requires pt to use RW for safe ambulation and to have difficulty on stairs, which she has a flight to enter her apartment. Recommend waiting to d/c pt until lightheadedness has ceased with function, as pt is at an increased risk of falling.    Follow Up Recommendations No PT follow up    Equipment Recommendations  None recommended by PT(can borrow RW for mother if needed)    Recommendations for Other Services       Precautions / Restrictions Precautions Precautions: Fall Precaution Comments: dizziness with standing/ambulation Restrictions Weight Bearing Restrictions: No      Mobility  Bed Mobility               General bed mobility comments: pt up in chair upon PT arrival  Transfers Overall transfer level: Needs assistance Equipment used: None Transfers: Sit  to/from Stand Sit to Stand: Supervision         General transfer comment: mild instability with onset of dizziness  Ambulation/Gait Ambulation/Gait assistance: Supervision Ambulation Distance (Feet): 175 Feet Assistive device: Rolling walker (2 wheeled) Gait Pattern/deviations: Step-through pattern Gait velocity: decreased Gait velocity interpretation: Below normal speed for age/gender General Gait Details: attempted to walk without AD however due to lightheadedness pt unsteady and reaching for objects to hold onto, pt asked to use walker to steady herself. pt denies lightheadedness to improve t/o ambulation  Stairs Stairs: Yes Stairs assistance: Min guard Stair Management: One rail Right Number of Stairs: 2 General stair comments: pt unsteady going down with noted LOB, used handrail to regain/maintain balance  Wheelchair Mobility    Modified Rankin (Stroke Patients Only) Modified Rankin (Stroke Patients Only) Pre-Morbid Rankin Score: No symptoms Modified Rankin: No significant disability     Balance Overall balance assessment: Needs assistance Sitting-balance support: Feet supported;No upper extremity supported Sitting balance-Leahy Scale: Good Sitting balance - Comments: pt able to don socks without difficulty   Standing balance support: No upper extremity supported Standing balance-Leahy Scale: Poor Standing balance comment: requires UE support due to lightheadedness                             Pertinent Vitals/Pain Pain Assessment: No/denies pain    Home Living Family/patient expects to be discharged to:: Private residence Living Arrangements: Children(dtr goes to school and works) Available Help at Discharge: Family;Available PRN/intermittently Type of Home: Apartment Home Access: Stairs  to enter Entrance Stairs-Rails: Right Entrance Stairs-Number of Steps: flight Home Layout: One level Home Equipment: None      Prior Function Level of  Independence: Independent         Comments: works in Pharmacist, hospital [employer]   Dominant Hand: Right    Extremity/Trunk Assessment   Upper Extremity Assessment Upper Extremity Assessment: Overall WFL for tasks assessed    Lower Extremity Assessment Lower Extremity Assessment: Overall WFL for tasks assessed    Cervical / Trunk Assessment Cervical / Trunk Assessment: Normal  Communication   Communication: No difficulties  Cognition Arousal/Alertness: Awake/alert Behavior During Therapy: WFL for tasks assessed/performed Overall Cognitive Status: Within Functional Limits for tasks assessed                                        General Comments General comments (skin integrity, edema, etc.): per RN tech pt completed whole bath in sitting except back    Exercises     Assessment/Plan    PT Assessment Patient needs continued PT services  PT Problem List Decreased activity tolerance;Decreased balance;Decreased mobility       PT Treatment Interventions DME instruction;Gait training;Stair training;Functional mobility training;Therapeutic activities;Therapeutic exercise;Balance training    PT Goals (Current goals can be found in the Care Plan section)  Acute Rehab PT Goals Patient Stated Goal: home today PT Goal Formulation: With patient Time For Goal Achievement: 01/05/18 Potential to Achieve Goals: Good Additional Goals Additional Goal #1: Pt to score >19 on DGI to indicate minimal falls risk.    Frequency Min 3X/week   Barriers to discharge        Co-evaluation               AM-PAC PT "6 Clicks" Daily Activity  Outcome Measure Difficulty turning over in bed (including adjusting bedclothes, sheets and blankets)?: None Difficulty moving from lying on back to sitting on the side of the bed? : None Difficulty sitting down on and standing up from a chair with arms (e.g., wheelchair, bedside commode, etc,.)?: None Help  needed moving to and from a bed to chair (including a wheelchair)?: A Little Help needed walking in hospital room?: A Little Help needed climbing 3-5 steps with a railing? : A Little 6 Click Score: 21    End of Session   Activity Tolerance: Patient tolerated treatment well Patient left: in chair;with call bell/phone within reach;with chair alarm set Nurse Communication: Mobility status PT Visit Diagnosis: Unsteadiness on feet (R26.81)    Time: 1610-9604 PT Time Calculation (min) (ACUTE ONLY): 24 min   Charges:   PT Evaluation $PT Eval Moderate Complexity: 1 Mod PT Treatments $Gait Training: 8-22 mins   PT G Codes:        Lewis Shock, PT, DPT Pager #: [phone] Office #: [phone]   Vyom Brass M Philmore Lepore 12/22/2017, 12:07 PM

## 2017-12-22 NOTE — Progress Notes (Signed)
  Echocardiogram 2D Echocardiogram has been performed.  Kimberly SavoyCasey N Adalie Watts 12/22/2017, 9:25 AM

## 2017-12-22 NOTE — Discharge Summary (Addendum)
Physician Discharge Summary   Patient ID: BOBBE QUILTER MRN: [id number] DOB/AGE: November 30, 1961 57 y.o.  Admit date: 12/21/2017 Discharge date: 12/22/2017  Primary Care Physician:  Blair Heys, MD  Discharge Diagnoses:    . TIA (transient ischemic attack) . Essential hypertension . Hypothyroidism Hyperlipidemia   Consults: Neurology  Recommendations for Outpatient Follow-up:  1. Discontinued aspirin, placed on Plavix 75 mg daily 2. Placed on Lipitor 40 mg daily, follow lipid panel in 4 weeks 3. Please follow blood/urine cultures till final   DIET: Heart healthy diet    Allergies:  No Known Allergies   DISCHARGE MEDICATIONS: Allergies as of 12/22/2017   No Known Allergies     Medication List    STOP taking these medications   aspirin 81 MG tablet   simvastatin 80 MG tablet Commonly known as:  ZOCOR Replaced by:  atorvastatin 40 MG tablet     TAKE these medications   amLODipine-benazepril 5-10 MG capsule Commonly known as:  LOTREL Take 1 capsule by mouth daily.   atenolol 25 MG tablet Commonly known as:  TENORMIN Take 25 mg by mouth daily.   atorvastatin 40 MG tablet Commonly known as:  LIPITOR Take 1 tablet (40 mg total) by mouth at bedtime. Replaces:  simvastatin 80 MG tablet   calcium-vitamin D 500-200 MG-UNIT tablet Commonly known as:  OSCAL WITH D Take 1 tablet by mouth daily.   cefUROXime 500 MG tablet Commonly known as:  CEFTIN Take 1 tablet (500 mg total) by mouth 2 (two) times daily with a meal. X 6 days   clopidogrel 75 MG tablet Commonly known as:  PLAVIX Take 1 tablet (75 mg total) by mouth daily.   dicyclomine 20 MG tablet Commonly known as:  BENTYL Take 20 mg by mouth every 6 (six) hours as needed. Abdominal cramps   fluticasone 50 MCG/ACT nasal spray Commonly known as:  FLONASE Place 1-2 sprays into the nose daily.   levothyroxine 50 MCG tablet Commonly known as:  SYNTHROID, LEVOTHROID Take 50 mcg by mouth at bedtime.    meclizine 25 MG tablet Commonly known as:  ANTIVERT Take 1 tablet (25 mg total) by mouth every 6 (six) hours as needed for dizziness.   omeprazole 20 MG capsule Commonly known as:  PRILOSEC Take 20 mg by mouth daily.        Brief H and P: For complete details please refer to admission H and P, but in brief LAVANA HUCKEBA is a 57 y.o. female with medical history significant of HTN, hypothyroidism, presented to [facility] ED with main concern of several days duration of progressively worsening lower quadrants abd pain that was mostly intermittent and throbbing, 5/10 in severity, not radiating, associated with dysuria. She has seen her PCP and was given prescription for ABX but says her symptoms have not improved. Patient also reports waking up at night time feeling dizzy, had one short episodes of slurred speech and felt somewhat week on the right side, arm > leg. Her symptoms are now mostly resolved but she says she still has some right facial numbness and right upper extremity weakness.   Patient was admitted for further workup.  UA consistent with UTI.  CT head did not show any stroke.     Hospital Course:     TIA (transient ischemic attack) -Symptoms of transient slurred speech, right-sided facial numbness and weakness in the right upper extremity resolved -MRI of the brain negative for acute CVA, MRA normal -2D echo showed EF of 55-60%,  normal wall motion -Carotid Dopplers showed right ICA 40-59%, left 139% -Discussed in detail with neurology, Dr. city, recommended discontinue aspirin, placed on Plavix 75 mg daily -Lipid panel showed triglycerides 107, LDL 115, goal 70, placed on Lipitor 40 mg daily, discontinued Zocor -PT eval showed no PT follow-up needed, back to baseline   Hyperlipidemia -Lipid panel showed LDL 115, goal 70, placed on Lipitor 40 mg daily    Essential hypertension -Permissive hypertension, restarted amlodipine, benazepril, may need another agent at follow-up  appointment  Urinary tract infection -Received IV Rocephin during hospitalization, placed on Ceftin at discharge    Hypothyroidism Continue levothyroxine  Day of Discharge BP (!) 148/74 (BP Location: Left Arm)   Pulse 62   Temp 98.6 F (37 C) (Oral)   Resp 20   Ht 5\' 5"  (1.651 m)   Wt 123.8 kg (273 lb)   SpO2 95%   BMI 45.43 kg/m   Physical Exam: General: Alert and awake oriented x3 not in any acute distress. HEENT: anicteric sclera, pupils reactive to light and accommodation CVS: S1-S2 clear no murmur rubs or gallops Chest: clear to auscultation bilaterally, no wheezing rales or rhonchi Abdomen: soft nontender, nondistended, normal bowel sounds Extremities: no cyanosis, clubbing or edema noted bilaterally Neuro: Cranial nerves II-XII intact, no focal neurological deficits   The results of significant diagnostics from this hospitalization (including imaging, microbiology, ancillary and laboratory) are listed below for reference.    LAB RESULTS: Basic Metabolic Panel: Recent Labs  Lab 12/21/17 0845 12/21/17 0855  NA 140 142  K 4.0 4.1  CL 105 104  CO2 26  --   GLUCOSE 115* 114*  BUN 14 14  CREATININE 0.93 1.00  CALCIUM 9.5  --    Liver Function Tests: Recent Labs  Lab 12/21/17 0845  AST 20  ALT 20  ALKPHOS 99  BILITOT 1.0  PROT 7.5  ALBUMIN 3.9   No results for input(s): LIPASE, AMYLASE in the last 168 hours. No results for input(s): AMMONIA in the last 168 hours. CBC: Recent Labs  Lab 12/21/17 0845 12/21/17 0855  WBC 7.0  --   NEUTROABS 4.2  --   HGB 13.7 14.6  HCT 42.4 43.0  MCV 97.2  --   PLT 250  --    Cardiac Enzymes: No results for input(s): CKTOTAL, CKMB, CKMBINDEX, TROPONINI in the last 168 hours. BNP: Invalid input(s): POCBNP CBG: No results for input(s): GLUCAP in the last 168 hours.  Significant Diagnostic Studies:  Ct Head Wo Contrast  Result Date: 12/21/2017 CLINICAL DATA:  Slurred speech and difficulty with words this  morning. EXAM: CT HEAD WITHOUT CONTRAST TECHNIQUE: Contiguous axial images were obtained from the base of the skull through the vertex without intravenous contrast. COMPARISON:  CT head dated September 12, 2014. FINDINGS: Brain: No evidence of acute infarction, hemorrhage, hydrocephalus, extra-axial collection or mass lesion/mass effect. Stable mild cortical atrophy. Vascular: No hyperdense vessel or unexpected calcification. Skull: Normal. Negative for fracture or focal lesion. Sinuses/Orbits: No acute finding. Other: None. IMPRESSION: 1. No acute intracranial abnormality. Stable mild cerebral atrophy. Electronically Signed   By: Obie DredgeWilliam T Derry M.D.   On: 12/21/2017 09:53   Mr Brain Wo Contrast  Result Date: 12/21/2017 CLINICAL DATA:  57 y/o F; episode of dizziness with right-sided weakness and slurred speech. Persistent right facial numbness and right upper extremity weakness. EXAM: MRI HEAD WITHOUT CONTRAST MRA HEAD WITHOUT CONTRAST TECHNIQUE: Multiplanar, multiecho pulse sequences of the brain and surrounding structures were obtained without intravenous  contrast. Angiographic images of the head were obtained using MRA technique without contrast. COMPARISON:  12/21/2017 CT head FINDINGS: MRI HEAD FINDINGS Brain: No acute infarction, hemorrhage, hydrocephalus, extra-axial collection or mass lesion. Few nonspecific foci of T2 FLAIR hyperintense signal abnormality in subcortical and periventricular white matter are compatible with mild chronic microvascular ischemic changes for age. Mild brain parenchymal volume loss. Incidental partially empty sella turcica. Vascular: As below. Skull and upper cervical spine: Normal marrow signal. Sinuses/Orbits: Negative. Other: None. MRA HEAD FINDINGS Internal carotid arteries:  Patent. Anterior cerebral arteries:  Patent. Middle cerebral arteries: Patent. Anterior communicating artery: Patent. Posterior communicating arteries: Not identified, likely hypoplastic or absent.  Posterior cerebral arteries:  Patent. Basilar artery:  Patent. Vertebral arteries:  Patent. No evidence of high-grade stenosis, large vessel occlusion, or aneurysm unless noted above. IMPRESSION: 1. No acute intracranial abnormality identified. 2. Mild chronic microvascular ischemic changes and parenchymal volume loss of the brain. 3. Normal MRA of the head. Electronically Signed   By: Mitzi Hansen M.D.   On: 12/21/2017 23:51   Ct Abdomen Pelvis W Contrast  Result Date: 12/21/2017 CLINICAL DATA:  Mid to lower abdominal pain for 1 week. EXAM: CT ABDOMEN AND PELVIS WITH CONTRAST TECHNIQUE: Multidetector CT imaging of the abdomen and pelvis was performed using the standard protocol following bolus administration of intravenous contrast. CONTRAST:  ISOVUE-300 IOPAMIDOL (ISOVUE-300) INJECTION 61% COMPARISON:  10/10/2016 FINDINGS: Lower chest: Lung bases are clear. No effusions. Heart is normal size. Hepatobiliary: Benign-appearing cyst anteriorly in the right hepatic lobe is stable. Gallbladder unremarkable. Pancreas: No focal abnormality or ductal dilatation. Spleen: Punctate calcification in the spleen compatible with old granulomatous disease. Normal size. Adrenals/Urinary Tract: No adrenal abnormality. No focal renal abnormality. No stones or hydronephrosis. Urinary bladder is unremarkable. Stomach/Bowel: Stomach, large and small bowel grossly unremarkable. No evidence of diverticulitis. Appendix is normal. Vascular/Lymphatic: No evidence of aneurysm or adenopathy. Aortic and iliac calcifications. Reproductive: Uterus and adnexa unremarkable.  No mass. Other: No free fluid or free air. Musculoskeletal: No acute bony abnormality. IMPRESSION: No acute findings in the abdomen or pelvis. Aortoiliac atherosclerosis. Electronically Signed   By: Charlett Nose M.D.   On: 12/21/2017 09:54   Mr Maxine Glenn Head Wo Contrast  Result Date: 12/21/2017 CLINICAL DATA:  57 y/o F; episode of dizziness with right-sided  weakness and slurred speech. Persistent right facial numbness and right upper extremity weakness. EXAM: MRI HEAD WITHOUT CONTRAST MRA HEAD WITHOUT CONTRAST TECHNIQUE: Multiplanar, multiecho pulse sequences of the brain and surrounding structures were obtained without intravenous contrast. Angiographic images of the head were obtained using MRA technique without contrast. COMPARISON:  12/21/2017 CT head FINDINGS: MRI HEAD FINDINGS Brain: No acute infarction, hemorrhage, hydrocephalus, extra-axial collection or mass lesion. Few nonspecific foci of T2 FLAIR hyperintense signal abnormality in subcortical and periventricular white matter are compatible with mild chronic microvascular ischemic changes for age. Mild brain parenchymal volume loss. Incidental partially empty sella turcica. Vascular: As below. Skull and upper cervical spine: Normal marrow signal. Sinuses/Orbits: Negative. Other: None. MRA HEAD FINDINGS Internal carotid arteries:  Patent. Anterior cerebral arteries:  Patent. Middle cerebral arteries: Patent. Anterior communicating artery: Patent. Posterior communicating arteries: Not identified, likely hypoplastic or absent. Posterior cerebral arteries:  Patent. Basilar artery:  Patent. Vertebral arteries:  Patent. No evidence of high-grade stenosis, large vessel occlusion, or aneurysm unless noted above. IMPRESSION: 1. No acute intracranial abnormality identified. 2. Mild chronic microvascular ischemic changes and parenchymal volume loss of the brain. 3. Normal MRA of the head.  Electronically Signed   By: Mitzi Hansen M.D.   On: 12/21/2017 23:51    2D ECHO: Study Conclusions  - Left ventricle: The cavity size was normal. Systolic function was   normal. The estimated ejection fraction was in the range of 55%   to 60%. Wall motion was normal; there were no regional wall   motion abnormalities. Left ventricular diastolic function   parameters were normal. - Atrial septum: No defect or  patent foramen ovale was identified.  Disposition and Follow-up: Discharge Instructions    Ambulatory referral to Neurology   Complete by:  As directed    An appointment is requested in approximately: 6 weeks Follow up with stroke clinic (Dr Pearlean Brownie preferred, if not available, then consider Sylvie Farrier, [facility] or Lucia Gaskins whoever is available) at [facility] in about 6-8 weeks. Thanks.   Diet - low sodium heart healthy   Complete by:  As directed    Increase activity slowly   Complete by:  As directed        DISPOSITION: home    DISCHARGE FOLLOW-UP Follow-up Information    Micki Riley, MD. Schedule an appointment as soon as possible for a visit in 6 week(s).   Specialties:  Neurology, Radiology Contact information: [address] [place] [address] [phone]            Time spent on Discharge:   Signed:   Thad Ranger M.D. [facility] 12/22/2017, 2:08 PM Pager: [phone]

## 2017-12-22 NOTE — Progress Notes (Signed)
NEUROHOSPITALISTS STROKE TEAM - DAILY PROGRESS NOTE   ADMISSION HISTORY: HPI: Kimberly Watts is a 57 y.o. female who has a past medical history of hypertension, hypercholesterolemia, hypothyroidism presented to [facility] emergency room for evaluation of multiple days of progressively worsening lower abdominal pain and dysuria.  She was seen by her PCP and given antibiotics but her symptoms did not resolve.  She woke up last night around 3 in the morning feeling dizzy and had a 15-minute episode of what she described as slurred speech, weakness of the right arm greater than leg as well as tingling and numbness of the whole right hemibody, that has nearly completely resolved at this time.  Because of these neurological symptoms, a neurological consultation was placed and she was admitted for a stroke/TIA workup. She had labs drawn which showed evidence of UTI, for which she is being treated at this time. She has never had any neurological symptoms prior to this presentation. She has a family history of strokes in her mother.  She has a family history of myocardial infarction-father died at the age of 21 from a heart attack. Denies smoking, alcohol use or illicit drug use. Has a sedentary lifestyle with a sedentary job.  Does not exercise.  LKW: 3 AM on 12/21/2017 tpa given?: no, outside the window Premorbid modified Rankin scale (mRS):0 NIHSS 0  SUBJECTIVE (INTERVAL HISTORY)  No family is at the bedside. Patient is found laying in bed in NAD. Overall she feels her condition is completely resolved. Voices no new complaints. No new events reported overnight.she states that she woke up from sleep with right face and arm numbness with palpitations and had cramp-like movement with dystonic posturing of the right hand. She denies similar episodes or other TIA or strokes in the past. She does have history of panic attacks in the past    OBJECTIVE Lab Results: CBC:  Recent Labs  Lab 12/21/17 0845 12/21/17 0855  WBC 7.0  --   HGB 13.7 14.6  HCT 42.4 43.0  MCV 97.2  --   PLT 250  --    BMP: Recent Labs  Lab 12/21/17 0845 12/21/17 0855  NA 140 142  K 4.0 4.1  CL 105 104  CO2 26  --   GLUCOSE 115* 114*  BUN 14 14  CREATININE 0.93 1.00  CALCIUM 9.5  --    Liver Function Tests:  Recent Labs  Lab 12/21/17 0845  AST 20  ALT 20  ALKPHOS 99  BILITOT 1.0  PROT 7.5  ALBUMIN 3.9   Coagulation Studies:  Recent Labs    12/21/17 0845  APTT 28  INR 0.95   Urine Drug Screen:     Component Value Date/Time   LABOPIA NONE DETECTED 12/21/2017 0720   COCAINSCRNUR NONE DETECTED 12/21/2017 0720   LABBENZ NONE DETECTED 12/21/2017 0720   AMPHETMU NONE DETECTED 12/21/2017 0720   THCU NONE DETECTED 12/21/2017 0720   LABBARB NONE DETECTED 12/21/2017 0720    Alcohol Level:  Recent Labs  Lab 12/21/17 0845  ETH <10   PHYSICAL EXAM Temp:  [97.5 F (36.4 C)-98.5 F (36.9 C)] 98.5 F (36.9 C) (01/08 0556) Pulse Rate:  [52-73] 57 (  01/08 0556) Resp:  [10-18] 18 (01/08 0556) BP: (122-157)/(56-93) 157/67 (01/08 0556) SpO2:  [92 %-98 %] 92 % (01/08 0556) Weight:  [123.8 kg (273 lb)] 123.8 kg (273 lb) (01/07 1145) General - obese middle aged african american lady, in no apparent distress HEENT-  Normocephalic, Normal external eye/conjunctiva.  Normal external ears. Normal external nose, mucus membranes and septum.   Cardiovascular - Regular rate and rhythm  Respiratory - Lungs clear bilaterally. No wheezing. Abdomen - soft and non-tender, BS normal Extremities- no edema or cyanosis Mental Status -  Level of arousal and orientation to time, place, and person were intact. Language including expression, naming, repetition, comprehension was assessed and found intact. Attention span and concentration were normal Recent and remote memory were intact Fund of Knowledge was assessed and was intact Cranial Nerves II  - XII - II - Visual field intact OU III, IV, VI - Extraocular movements intact. V - Facial sensation intact bilaterally. VII - Facial movement intact bilaterally VIII - Hearing & vestibular intact bilaterally X - Palate elevates symmetrically XI - Chin turning & shoulder shrug intact bilaterally. XII - Tongue protrusion intact Motor Strength - The patient's strength was normal in all extremities and pronator drift was absent.  Bulk was normal and fasciculations were absent  Motor Tone - Muscle tone was assessed at the neck and appendages and was normal Reflexes - The patient's reflexes were symmetrical in all extremities and she had no pathological reflexes Sensory - Light touch was assessed and was symmetrical Coordination - The patient had normal movements in the hands and feet with no ataxia or dysmetria.  Tremor was absent Gait and Station - deferred.  IMAGING: I have personally reviewed the radiological images below and agree with the radiology interpretations. Ct Head Wo Contrast Result Date: 12/21/2017 IMPRESSION: 1. No acute intracranial abnormality. Stable mild cerebral atrophy. Electronically Signed   By: Obie DredgeWilliam T Derry M.D.   On: 12/21/2017 09:53   Mr Maxine GlennMra Head Wo Contrast Result Date: 12/21/2017 IMPRESSION: 1. No acute intracranial abnormality identified. 2. Mild chronic microvascular ischemic changes and parenchymal volume loss of the brain. 3. Normal MRA of the head. Electronically Signed   By: Mitzi HansenLance  Furusawa-Stratton M.D.   On: 12/21/2017 23:51   Echocardiogram:                                               Study Conclusions - Left ventricle: The cavity size was normal. Systolic function was   normal. The estimated ejection fraction was in the range of 55%   to 60%. Wall motion was normal; there were no regional wall   motion abnormalities. Left ventricular diastolic function   parameters were normal. - Atrial septum: No defect or patent foramen ovale was  identified.  B/L Carotid U/S:                                                 Preliminary findings:  Right Carotid: There is evidence in the distal right ICA of a low end 40-59% stenosis, could be secondary to diving vessel. Difficult exam due to patient body habitus, pulsating vessels and snoring.  Left Carotid: There is evidence in the left ICA of a 1-39% stenosis. Difficult  exam due to patient body habitus, pulsating vessels and snoring.  Vertebrals: Both vertebral arteries were patent with antegrade flow.      IMPRESSION: Kimberly Watts is a 57 y.o. female with PMH of hypertension, hyperlipidemia and hypothyroidism presenting for evaluation of lower abdominal pain also reported a 15-minute episode of slurred speech, right facial arm and leg numbness and weakness, that has since resolved. She was admitted for a TIA workup on top of evaluation of her abdominal complaints. MRI of the brain does not show any acute changes.  No evidence of stenosis in the cervical or intracerebral vessels. Given her risk factors, this could reflect a TIA but the story is rather atypical.  TIA vs PANIC ATTACK  Suspected Etiology: small vessel disease and anxiety Resultant Symptoms: Right sided numbness Stroke Risk Factors: hyperlipidemia and hypertension Other Stroke Risk Factors: Hypothyroidism,  Obesity, Body mass index is 45.43 kg/m.   Outstanding Stroke Work-up Studies:    Work up completed  12/22/2017 ASSESSMENT:   Neuro exam stable and non focal. Patient describes an episode of Right sided numbness from her forehead to her toes. More likely a panic attack. But patient does have RICA stenosis and other stroke risk factors. Will change ASA to Plavix and likely need to increase Lipitor dose. Labs pending.  PLAN  12/22/2017: Continue Plavix/ Statin Frequent neuro checks Telemetry monitoring PT/OT/SLP Ongoing aggressive stroke risk factor management Patient counseled to be compliant with her  antithrombotic medications Patient counseled on Lifestyle modifications including, Diet, Exercise, and Stress Follow up with [facility] in 6 weeks  INTRACRANIAL Atherosclerosis &Stenosis: Changed ASA to Plavix for better stroke risk factor management  HYPERTENSION: Stable Long term BP goal normotensive. May restart home B/P medications  Home Meds: Norvasc, Lotensin  HYPERLIPIDEMIA: Home Meds:  Lipitor 40 mg LDL  goal < 70 Continued on Lipitor to 40 mg daily for now, Lipid Panel PENDING, adjust dose higher if appropriate Continue statin at discharge  DIABETES: No results found for: HGBA1C - PENDING No results for input(s): GLUCAP in the last 168 hours. HgbA1c goal < 7.0  OBESITY Morbid Obesity, Body mass index is 45.43 kg/m.  Dietitian consulted  Other Active Problems: Active Problems:   Weakness    Hospital day # 1 VTE prophylaxis: Lovenox  Diet : Diet Heart Room service appropriate? Yes; Fluid consistency: Thin   FAMILY UPDATES: No family at bedside  TEAM UPDATES: Rai, Delene Ruffini, MD   Prior Home Stroke Medications:  aspirin 81 mg daily  Discharge Stroke Meds:  Please discharge patient on clopidogrel 75 mg daily   Disposition: 01-Home or Self Care Therapy Recs:               HOME Home Equipment:         NONE Follow Up:  System, Pcp Not In -PCP Follow up in 1-2 weeks    Micki Riley, MD  Neurology Radiology [phone] [address] [place] [address]     Assessment & plan discussed with with attending physician and they are in agreement.    Beryl Meager, ANP-C Stroke Neurology Team 12/22/2017 11:45 AM I have personally examined this patient, reviewed notes, independently viewed imaging studies, participated in medical decision making and plan of care.ROS completed by me personally and pertinent positives fully documented  I have made any additions or clarifications directly to the above note. Agree with  note above.  She presented with transient right hemi-facial and upper  extremity numbness which is not in a typical dermatomal distribution for vascular TIA and given history of palpitations and right hand posturing panic attack is a possibility. She however does have vascular risk factors and needs those to be addressed to lower her risk for TIA and strokes in the future. Recommend changing aspirin to Plavix for stroke prevention. Greater than 50% time during this 25 minute visit was spent on counseling and coordination of care about stroke and TIA prevention and answering questions. Discussed with Dr. Loleta Rose, MD Medical Director [facility] Pager: [phone] 12/22/2017 2:15 PM  Neurology to sign-off at this time. Please call with any further questions or concerns. Thank you for this consultation.  To contact Stroke Continuity provider, please refer to [email or web address]. After hours, contact General Neurology

## 2017-12-22 NOTE — Progress Notes (Signed)
Pt being discharged home via wheelchair with family. Pt alert and oriented x4. VSS. Pt c/o no pain at this time. No signs of respiratory distress. Education complete and care plans resolved. IV removed with catheter intact and pt tolerated well. No further issues at this time. Pt to follow up with PCP. Venesa Semidey R, RN 

## 2017-12-22 NOTE — Consult Note (Signed)
Neurology Consultation  Reason for Consult: TIA Referring Physician: Dr. Izola Price  CC: Right-sided facial numbness, slurred speech, right upper extremity weakness and dizziness  History is obtained from: Chart, patient  HPI: Kimberly Watts is a 56 y.o. female who has a past medical history of hypertension, hypercholesterolemia, hypothyroidism presented to [facility] emergency room for evaluation of multiple days of progressively worsening lower abdominal pain and dysuria.  She was seen by her PCP and given antibiotics but her symptoms did not resolve.  She woke up last night around 3 in the morning feeling dizzy and had a 15-minute episode of what she described as slurred speech, weakness of the right arm greater than leg as well as tingling and numbness of the whole right hemibody, that has nearly completely resolved at this time.  Because of these neurological symptoms, a neurological consultation was placed and she was admitted for a stroke/TIA workup. She had labs drawn which showed evidence of UTI, for which she is being treated at this time. She has never had any neurological symptoms prior to this presentation. She has a family history of strokes in her mother.  She has a family history of myocardial infarction-father died at the age of 8 from a heart attack. Denies smoking, alcohol use or illicit drug use. Has a sedentary lifestyle with a sedentary job.  Does not exercise.  LKW: 3 AM on 12/21/2017 tpa given?: no, outside the window Premorbid modified Rankin scale (mRS):0  ROS: Review of Systems  Constitutional: Positive for malaise/fatigue. Negative for chills, fever and weight loss.  HENT: Negative for hearing loss and tinnitus.   Eyes: Negative for blurred vision and double vision.  Respiratory: Negative for cough and hemoptysis.   Cardiovascular: Negative for chest pain and palpitations.  Gastrointestinal: Positive for abdominal pain (As described above-lower abdomen.). Negative  for constipation.  Genitourinary: Negative for dysuria and urgency.  Musculoskeletal: Negative for myalgias and neck pain.  Skin: Negative for itching and rash.  Neurological: Positive for dizziness (Resolved), tingling (Resolved), speech change (Resolved) and focal weakness (Resolved). Negative for seizures and loss of consciousness.  Endo/Heme/Allergies: Negative for environmental allergies. Does not bruise/bleed easily.  Psychiatric/Behavioral: Negative for depression and suicidal ideas.    Past Medical History:  Diagnosis Date  . GERD (gastroesophageal reflux disease)   . High cholesterol   . Hypertension   . Thyroid disease    Family History  Problem Relation Age of Onset  . Stroke Mother   . Diabetes Mother   . Hypertension Mother   . Diabetes Father   . Hypertension Father   . Heart attack Father    Social History:   reports that  has never smoked. she has never used smokeless tobacco. She reports that she does not drink alcohol or use drugs.  Medications  Current Facility-Administered Medications:  .  acetaminophen (TYLENOL) tablet 650 mg, 650 mg, Oral, Q4H PRN **OR** acetaminophen (TYLENOL) solution 650 mg, 650 mg, Per Tube, Q4H PRN **OR** acetaminophen (TYLENOL) suppository 650 mg, 650 mg, Rectal, Q4H PRN, Dorothea Ogle, MD .  amLODipine (NORVASC) tablet 5 mg, 5 mg, Oral, Daily, 5 mg at 12/21/17 1729 **AND** benazepril (LOTENSIN) tablet 10 mg, 10 mg, Oral, Daily, Dorothea Ogle, MD, 10 mg at 12/21/17 1730 .  aspirin chewable tablet 81 mg, 81 mg, Oral, Daily, Dorothea Ogle, MD .  atenolol (TENORMIN) tablet 25 mg, 25 mg, Oral, Daily, Dorothea Ogle, MD, 25 mg at 12/21/17 1729 .  atorvastatin (LIPITOR) tablet  40 mg, 40 mg, Oral, q1800, Dorothea Ogle, MD, 40 mg at 12/21/17 1729 .  calcium-vitamin D (OSCAL WITH D) 500-200 MG-UNIT per tablet 1 tablet, 1 tablet, Oral, Q breakfast, Dorothea Ogle, MD, 1 tablet at 12/21/17 1729 .  cefTRIAXone (ROCEPHIN) 2 g in dextrose 5 % 50  mL IVPB, 2 g, Intravenous, Q24H, Dorothea Ogle, MD .  dicyclomine (BENTYL) tablet 20 mg, 20 mg, Oral, Q6H PRN, Dorothea Ogle, MD .  enoxaparin (LOVENOX) injection 40 mg, 40 mg, Subcutaneous, Q24H, Dorothea Ogle, MD, 40 mg at 12/21/17 2149 .  hydrALAZINE (APRESOLINE) injection 5 mg, 5 mg, Intravenous, Q4H PRN, Dorothea Ogle, MD .  HYDROcodone-acetaminophen (NORCO/VICODIN) 5-325 MG per tablet 1 tablet, 1 tablet, Oral, Q4H PRN, Dorothea Ogle, MD, 1 tablet at 12/21/17 2150 .  levothyroxine (SYNTHROID, LEVOTHROID) tablet 50 mcg, 50 mcg, Oral, QHS, Dorothea Ogle, MD, 50 mcg at 12/21/17 2150 .  meclizine (ANTIVERT) tablet 25 mg, 25 mg, Oral, Q6H PRN, Dorothea Ogle, MD .  pantoprazole (PROTONIX) EC tablet 40 mg, 40 mg, Oral, Daily, Dorothea Ogle, MD  Exam: Current vital signs: BP 139/70 (BP Location: Right Arm)   Pulse 67   Temp 97.9 F (36.6 C) (Oral)   Resp 18   Ht 5\' 5"  (1.651 m)   Wt 123.8 kg (273 lb)   SpO2 94%   BMI 45.43 kg/m  Vital signs in last 24 hours: Temp:  [97.5 F (36.4 C)-98.2 F (36.8 C)] 97.9 F (36.6 C) (01/08 0107) Pulse Rate:  [52-74] 67 (01/08 0107) Resp:  [10-18] 18 (01/08 0107) BP: (122-170)/(56-94) 139/70 (01/08 0107) SpO2:  [94 %-98 %] 94 % (01/08 0107) Weight:  [123.8 kg (273 lb)] 123.8 kg (273 lb) (01/07 1145)  GENERAL: Awake, alert in NAD HEENT: - Normocephalic and atraumatic, dry mm, no LN++, no Thyromegally LUNGS - Clear to auscultation bilaterally with no wheezes CV - S1S2 RRR, no m/r/g, equal pulses bilaterally. ABDOMEN - Soft, tender to palpation in the right and left lower quadrants, nondistended with normoactive BS Ext: warm, well perfused, intact peripheral pulses, no edema  NEURO:  Mental Status: AA&Ox3  Language: speech is clear.  Naming, repetition, fluency, and comprehension intact. Cranial Nerves: PERRL. EOMI, visual fields full, no facial asymmetry, facial sensation intact, hearing intact, tongue/uvula/soft palate midline, normal  sternocleidomastoid and trapezius muscle strength. No evidence of tongue atrophy or fibrillations Motor: 5/5 in all fours Tone: is normal and bulk is normal Sensation- Intact to light touch bilaterally-no extinction Coordination: FTN intact bilaterally Gait- deferred  NIHSS 0  Labs I have reviewed labs in epic and the results pertinent to this consultation are:  CBC    Component Value Date/Time   WBC 7.0 12/21/2017 0845   RBC 4.36 12/21/2017 0845   HGB 14.6 12/21/2017 0855   HCT 43.0 12/21/2017 0855   PLT 250 12/21/2017 0845   MCV 97.2 12/21/2017 0845   MCH 31.4 12/21/2017 0845   MCHC 32.3 12/21/2017 0845   RDW 13.3 12/21/2017 0845   LYMPHSABS 2.2 12/21/2017 0845   MONOABS 0.5 12/21/2017 0845   EOSABS 0.1 12/21/2017 0845   BASOSABS 0.0 12/21/2017 0845    CMP     Component Value Date/Time   NA 142 12/21/2017 0855   K 4.1 12/21/2017 0855   CL 104 12/21/2017 0855   CO2 26 12/21/2017 0845   GLUCOSE 114 (H) 12/21/2017 0855   BUN 14 12/21/2017 0855   CREATININE 1.00 12/21/2017 0855  CALCIUM 9.5 12/21/2017 0845   PROT 7.5 12/21/2017 0845   ALBUMIN 3.9 12/21/2017 0845   AST 20 12/21/2017 0845   ALT 20 12/21/2017 0845   ALKPHOS 99 12/21/2017 0845   BILITOT 1.0 12/21/2017 0845   GFRNONAA >60 12/21/2017 0845   GFRAA >60 12/21/2017 0845   UA positive for leukocyte esterase, many bacteria.  Imaging I have reviewed the images obtained: CT head-no acute changes. MRI examination of the brain -no acute changes MRA of the brain- no significant stenosis in the intracranial or cervical vessels.  Report of CT abdomen reviewed-no acute changes.  Assessment:  57 year old with hypertension, hyperlipidemia and hypothyroidism presenting for evaluation of lower abdominal pain also reported a 15-minute episode of slurred speech, right facial arm and leg numbness and weakness, that has since resolved. She was admitted for a TIA workup on top of evaluation of her abdominal  complaints. MRI of the brain does not show any acute changes.  No evidence of stenosis in the cervical or intracerebral vessels. Given her risk factors, this could reflect a TIA but the story is rather atypical.  Impression: Evaluate for possible TIA Slurred speech, right-sided weakness -resolved  Recommendations: -Telemetry monitoring -Allow for permissive hypertension for the first 24-48h - only treat PRN if SBP >220 mmHg. Blood pressures can be gradually normalized to SBP<140 upon discharge. -Echocardiogram -HgbA1c, fasting lipid panel -Frequent neuro checks -Prophylactic therapy-Antiplatelet med: Aspirin - dose 325mg  PO or 300mg  PR -Atorvastatin 80 mg PO daily -Risk factor modification -PT consult, OT consult, Speech consult -Management of lower abdominal pain and discomfort per primary team  Please page stroke NP/PA/MD (listed on AMION)  from 8am-4 pm as this patient will be followed by the stroke team at this point.   -- Milon DikesAshish Chanda Laperle, MD [facility] [phone] If 7pm to 7am, please call on call as listed on AMION.

## 2017-12-22 NOTE — Progress Notes (Signed)
Patient is still having abdominal lower quadrant pain does not appear to be intestinal as it is lower possible bladder pain? Will continue to monitor.

## 2017-12-22 NOTE — Discharge Instructions (Signed)

## 2017-12-22 NOTE — Evaluation (Signed)
Occupational Therapy Evaluation Patient Details Name: Kimberly Watts MRN: [id number] DOB: [birth year] Today's Date: 12/22/2017    History of Present Illness 57 y.o. female admitted with progressively worsening lower abdominal pain and dysuria as well as dizziness, slurred speech, and weakness.  MRI of brain showed no acute abnormality.  PMH includes:  HTN    Clinical Impression   Patient evaluated by Occupational Therapy with no further acute OT needs identified. All education has been completed and the patient has no further questions. Pt is mod I with ADLs.  She demonstrates mildly decreased activity tolerance.  See below for any follow-up Occupational Therapy or equipment needs. OT is signing off. Thank you for this referral.      Follow Up Recommendations  No OT follow up;Supervision - Intermittent    Equipment Recommendations  None recommended by OT    Recommendations for Other Services       Precautions / Restrictions Precautions Precautions: Fall Precaution Comments: dizziness with standing/ambulation Restrictions Weight Bearing Restrictions: No      Mobility Bed Mobility Overal bed mobility: Independent             Transfers Overall transfer level: Needs assistance Equipment used: None Transfers: Sit to/from Stand;Stand Pivot Transfers Sit to Stand: Modified independent (Device/Increase time) Stand pivot transfers: Modified independent (Device/Increase time)       General transfer comment: pt moves slowly     Balance Overall balance assessment: No apparent balance deficits (not formally assessed) Sitting-balance support: Feet supported;No upper extremity supported Sitting balance-Leahy Scale: Good                            ADL either performed or assessed with clinical judgement   ADL Overall ADL's : Modified independent                                       General ADL Comments: Pt able to simulate shower in standing  without LOB      Vision Baseline Vision/History: Wears glasses Wears Glasses: At all times Patient Visual Report: No change from baseline Vision Assessment?: No apparent visual deficits     Perception Perception Perception Tested?: Yes   Praxis Praxis Praxis tested?: Within functional limits    Pertinent Vitals/Pain Pain Assessment: Faces Faces Pain Scale: Hurts a little bit Pain Location: abdomen  Pain Descriptors / Indicators: Aching Pain Intervention(s): Monitored during session     Hand Dominance Right   Extremity/Trunk Assessment Upper Extremity Assessment Upper Extremity Assessment: Overall WFL for tasks assessed   Lower Extremity Assessment Lower Extremity Assessment: Defer to PT evaluation   Cervical / Trunk Assessment Cervical / Trunk Assessment: Normal   Communication Communication Communication: No difficulties   Cognition Arousal/Alertness: Awake/alert Behavior During Therapy: WFL for tasks assessed/performed Overall Cognitive Status: Within Functional Limits for tasks assessed                                     General Comments  Reviewed BEFAST with pt.     Exercises     Shoulder Instructions      Home Living Family/patient expects to be discharged to:: Private residence Living Arrangements: Children(dtr goes to school and works) Available Help at Discharge: Family;Available PRN/intermittently Type of Home: Apartment Home Access: Stairs to enter Entrance  Stairs-Number of Steps: flight Entrance Stairs-Rails: Right Home Layout: One level     Bathroom Shower/Tub: Chief Strategy OfficerTub/shower unit   Bathroom Toilet: Standard     Home Equipment: None          Prior Functioning/Environment Level of Independence: Independent        Comments: works in [employer]        OT Problem List: Decreased activity tolerance      OT Treatment/Interventions:      OT Goals(Current goals can be found in the care plan section) Acute  Rehab OT Goals Patient Stated Goal: home today OT Goal Formulation: All assessment and education complete, DC therapy  OT Frequency:     Barriers to D/C:            Co-evaluation              AM-PAC PT "6 Clicks" Daily Activity     Outcome Measure Help from another person eating meals?: None Help from another person taking care of personal grooming?: None Help from another person toileting, which includes using toliet, bedpan, or urinal?: None Help from another person bathing (including washing, rinsing, drying)?: None Help from another person to put on and taking off regular upper body clothing?: None Help from another person to put on and taking off regular lower body clothing?: None 6 Click Score: 24   End of Session Nurse Communication: Mobility status  Activity Tolerance: Patient tolerated treatment well Patient left: in bed;with call bell/phone within reach;with bed alarm set  OT Visit Diagnosis: Unsteadiness on feet (R26.81)                Time: 1313-1330 OT Time Calculation (min): 17 min Charges:  OT General Charges $OT Visit: 1 Visit OT Evaluation $OT Eval Low Complexity: 1 Low G-Codes:     Reynolds AmericanWendi Maurilio Puryear, OTR/L [phone]   Jeani HawkingConarpe, Giovani Neumeister M 12/22/2017, 2:24 PM

## 2017-12-22 NOTE — Progress Notes (Signed)
*  PRELIMINARY RESULTS* Vascular Ultrasound Carotid Duplex (Doppler) has been completed.  Preliminary findings:  Right Carotid: There is evidence in the distal right ICA of a low end 40-59% stenosis, could be secondary to diving vessel. Difficult exam due to patient body habitus, pulsating vessels and snoring.  Left Carotid: There is evidence in the left ICA of a 1-39% stenosis. Difficult exam due to patient body habitus, pulsating vessels and snoring.   Vertebrals: Both vertebral arteries were patent with antegrade flow.    Kimberly FischerCharlotte C Harlo Watts 12/22/2017, 9:55 AM

## 2017-12-22 NOTE — Progress Notes (Signed)
SLP Cancellation Note  Patient Details Name: Kimberly Watts MRN: [id number] DOB: 01/28/1961   Cancelled treatment:       Reason Eval/Treat Not Completed: SLP screened, no needs identified, will sign off    Moana Munford, Riley NearingBonnie Caroline 12/22/2017, 8:59 AM

## 2017-12-22 NOTE — Care Management Note (Signed)
Case Management Note  Patient Details  Name: Kimberly ReapLoretha J Hoog MRN: [id number] Date of Birth: 04/26/1961  Subjective/Objective:    Pt in with weakness. She is from home with her daughter.                 Action/Plan: CM consulted for patient not having a PcP listed. Patient states her PCP is Dr Manus GunningEhinger.  Pt states her daughter is able to provide transportation home before 4 pm. Bedside RN updated.   Expected Discharge Date:  12/22/17               Expected Discharge Plan:  Home/Self Care  In-House Referral:     Discharge planning Services  CM Consult  Post Acute Care Choice:    Choice offered to:     DME Arranged:    DME Agency:     HH Arranged:    HH Agency:     Status of Service:  Completed, signed off  If discussed at [employer] Length of Stay Meetings, dates discussed:    Additional Comments:  Kermit BaloKelli F Kenita Bines, RN 12/22/2017, 1:16 PM

## 2017-12-29 ENCOUNTER — Other Ambulatory Visit: Payer: Self-pay | Admitting: *Deleted

## 2017-12-29 ENCOUNTER — Encounter: Payer: Self-pay | Admitting: *Deleted

## 2017-12-29 NOTE — Patient Outreach (Addendum)
[facility] Care Management  12/29/2017  Kimberly Watts 05/19/1961 [id number]   Subjective: Telephone call to patient's home / mobile number, spoke with patient, and HIPAA verified.  [facility] [employer] Transition of care follow up, patient voiced understanding, and is in agreement to follow up.  Patient states she is doing well, has returned to work, and has a follow up appointment with neurologist on 02/23/18.  Patient states she is able to manage self care and has assistance as needed.  Patient voices understanding of medical diagnosis and treatment plan.  States she is accessing her Kimberly Watts benefits as needed via member services number on back of card or through [email or web address].  Patient states he does not have any education material, transition of care, care coordination, disease management, disease monitoring, transportation, community resource, or pharmacy needs at this time.  States she is very appreciative of the follow up and is in agreement to receive Kimberly Watts Care Management information.  Requested educational materials on healthy eating and is in agreement to receive the following handout: Diet and Health.    Objective: Per KPN (Knowledge Performance Now, point of care tool), [employer] iCollaborate, and chart review, patient hospitalized patient hospitalized 12/21/17 -12/22/17 for TIA (transient ischemic attack).    Patient also has a history of hypertension, hypothyroidism, and hyperlipidemia.      Assessment: Received [employer] Transition of care referral on 12/28/17. Transition of care follow up completed, no care management needs, and will proceed with case closure.      Plan: RNCM will send patient successful outreach letter, [facility] pamphlet, magnet, and education handout Health and Diet. RNCM will send case closure due to follow up completed / no care management needs request to [facility].      Reshard Guillet H. Gardiner Barefootooper RN, BSN, CCM [facility] Care  Management [place] Watts Telephonic CM Phone: [phone] Fax: [phone]

## 2018-02-23 ENCOUNTER — Ambulatory Visit: Payer: Managed Care, Other (non HMO) | Admitting: Neurology

## 2018-02-23 ENCOUNTER — Encounter: Payer: Self-pay | Admitting: Neurology

## 2018-02-23 ENCOUNTER — Encounter (INDEPENDENT_AMBULATORY_CARE_PROVIDER_SITE_OTHER): Payer: Self-pay

## 2018-02-23 VITALS — BP 145/80 | HR 66 | Wt 276.4 lb

## 2018-02-23 DIAGNOSIS — G459 Transient cerebral ischemic attack, unspecified: Secondary | ICD-10-CM

## 2018-02-23 MED ORDER — SIMVASTATIN 40 MG PO TABS: 40.0000 mg | ORAL_TABLET | Freq: Every day | ORAL | 3 refills | Status: DC

## 2018-02-23 NOTE — Progress Notes (Signed)
[facility] [address] [place]. [place] [address] [phone]       OFFICE FOLLOW-UP NOTE  Kimberly. Kimberly Watts Date of Birth:  09-29-1961 Medical Record Number:  [id number]   HPI: Kimberly Watts is a 41 year African-American lady seen today for first office follow-up visit following hospital admission for TIA in January 2019. History is obtained from the patient, review of electronic medical records and I have personally reviewed imaging films. Kimberly Watts is a 57 y.o. female who has a past medical history of hypertension, hypercholesterolemia, hypothyroidism presented to [facility] emergency room for evaluation of multiple days of progressively worsening lower abdominal pain and dysuria.  She was seen by her PCP and given antibiotics but her symptoms did not resolve.  She woke up last night around 3 in the morning feeling dizzy and had a 15-minute episode of what she described as slurred speech, weakness of the right arm greater than leg as well as tingling and numbness of the whole right hemibody, and cramp-like posturing of the right upper extremity that has nearly completely resolved at this time.  Because of these neurological symptoms, a neurological consultation was placed and she was admitted for a stroke/TIA workup. She had labs drawn which showed evidence of UTI, for which she is being treated at this time. She has never had any neurological symptoms prior to this presentation. She has a family history of strokes in her mother.  She has a family history of myocardial infarction-father died at the age of 55 from a heart attack. Denies smoking, alcohol use or illicit drug use. Has a sedentary lifestyle with a sedentary job.  Does not exercise. LKW: 3 AM on 12/21/2017 tpa given?: no, outside the window Premorbid modified Rankin scale (mRS):0 NIHSS 0     CT scan of the head on admission was unremarkable an MRI scan of the brain also did not reveal an acute infarct. MRI  of the brain and carotid ultrasound was unremarkable except for mild 40-59% right distal ICA stenosis. Transthoracic echo showed normal ejection fraction. LDL cholesterol was elevated at 115 mg percent and hemoglobin A1c was 5.9. Patient was started on Plavix for stroke prevention and Lipitor. She states she is having trouble tolerating Lipitor due to muscle aches and pains in has stopped it from today. She states in the past she has tolerated simvastatin quite well and would like to go back on it. She states her blood pressure is well controlled though it is slightly borderline today at 145/80. She has no other new complaints today. She denies any prior history of TIA or strokes.   ROS:   14 system review of systems is positive for muscle aches and pains, joint and back pain, muscle cramps and all other systems negative  PMH:  Past Medical History:  Diagnosis Date  . GERD (gastroesophageal reflux disease)   . High cholesterol   . Hypertension   . Thyroid disease     Social History:  Social History   Socioeconomic History  . Marital status: Married    Spouse name: Not on file  . Number of children: Not on file  . Years of education: Not on file  . Highest education level: Not on file  Social Needs  . Financial resource strain: Not on file  . Food insecurity - worry: Not on file  . Food insecurity - inability: Not on file  . Transportation needs - medical: Not on file  . Transportation needs -  non-medical: Not on file  Occupational History  . Not on file  Tobacco Use  . Smoking status: Never Smoker  . Smokeless tobacco: Never Used  Substance and Sexual Activity  . Alcohol use: No  . Drug use: No  . Sexual activity: Not on file  Other Topics Concern  . Not on file  Social History Narrative  . Not on file    Medications:   Current Outpatient Medications on File Prior to Visit  Medication Sig Dispense Refill  . amLODipine-benazepril (LOTREL) 5-10 MG per capsule Take 1  capsule by mouth daily.    Marland Kitchen atenolol (TENORMIN) 25 MG tablet Take 25 mg by mouth daily.    . calcium-vitamin D (OSCAL WITH D) 500-200 MG-UNIT per tablet Take 1 tablet by mouth daily.    . clopidogrel (PLAVIX) 75 MG tablet Take 1 tablet (75 mg total) by mouth daily. 30 tablet 3  . dicyclomine (BENTYL) 20 MG tablet Take 20 mg by mouth every 6 (six) hours as needed. Abdominal cramps  0  . levothyroxine (SYNTHROID, LEVOTHROID) 50 MCG tablet Take 50 mcg by mouth at bedtime.  1  . meclizine (ANTIVERT) 25 MG tablet Take 1 tablet (25 mg total) by mouth every 6 (six) hours as needed for dizziness. 28 tablet 0  . omeprazole (PRILOSEC) 20 MG capsule Take 20 mg by mouth daily.     No current facility-administered medications on file prior to visit.     Allergies:  No Known Allergies  Physical Exam General: Obese middle-aged African-American lady, seated, in no evident distress Head: head normocephalic and atraumatic.  Neck: supple with no carotid or supraclavicular bruits Cardiovascular: regular rate and rhythm, no murmurs Musculoskeletal: no deformity Skin:  no rash/petichiae Vascular:  Normal pulses all extremities Vitals:   02/23/18 1034  BP: (!) 145/80  Pulse: 66   Neurologic Exam Mental Status: Awake and fully alert. Oriented to place and time. Recent and remote memory intact. Attention span, concentration and fund of knowledge appropriate. Mood and affect appropriate.  Cranial Nerves: Fundoscopic exam reveals sharp disc margins. Pupils equal, briskly reactive to light. Extraocular movements full without nystagmus. Visual fields full to confrontation. Hearing intact. Facial sensation intact. Face, tongue, palate moves normally and symmetrically.  Motor: Normal bulk and tone. Normal strength in all tested extremity muscles. Sensory.: intact to touch ,pinprick .position and vibratory sensation.  Coordination: Rapid alternating movements normal in all extremities. Finger-to-nose and  heel-to-shin performed accurately bilaterally. Gait and Station: Arises from chair without difficulty. Stance is normal. Gait demonstrates normal stride length and balance . Able to heel, toe and tandem walk without difficulty.  Reflexes: 1+ and symmetric. Toes downgoing.   NIHSS  0 Modified Rankin 1   ASSESSMENT: 57 year old African-American lady with episode of posterior circulation TIA in January 2019. Vascular risk factors of obesity, hypertension, hyperlipidemia.but now showing Lipitor intolerance    PLAN: I had a long d/w patient about his recent stroke, risk for recurrent stroke/TIAs, personally independently reviewed imaging studies and stroke evaluation results and answered questions.Continue Plavix for secondary stroke prevention and maintain strict control of hypertension with blood pressure goal below 130/90, diabetes with hemoglobin A1c goal below 6.5% and lipids with LDL cholesterol goal below 70 mg/dL. I also advised the patient to eat a healthy diet with plenty of whole grains, cereals, fruits and vegetables, exercise regularly and maintain ideal body weight recommend discontinue Lipitor due to statin myalgias and instead switch to simvastatin which she has tolerated in the past..Consider possible  participation in the [facility] stroke prevention trial if interested. Followup in the future with my nurse practitioner Shanda BumpsJessica in  6 months or call earlier if necessary Greater than 50% of time during this 25 minute visit was spent on counseling,explanation of diagnosis, planning of further management, discussion with patient and family and coordination of care Delia HeadyPramod Ellenor Wisniewski, MD  [facility] [address] [place], [place] [id number]  Phone [phone] Fax [phone] Note: This document was prepared with digital dictation and possible smart phrase technology. Any transcriptional errors that result from this process are unintentional

## 2018-02-23 NOTE — Patient Instructions (Signed)
I had a long d/w patient about his recent stroke, risk for recurrent stroke/TIAs, personally independently reviewed imaging studies and stroke evaluation results and answered questions.Continue Plavix for secondary stroke prevention and maintain strict control of hypertension with blood pressure goal below 130/90, diabetes with hemoglobin A1c goal below 6.5% and lipids with LDL cholesterol goal below 70 mg/dL. I also advised the patient to eat a healthy diet with plenty of whole grains, cereals, fruits and vegetables, exercise regularly and maintain ideal body weight recommend discontinue Lipitor due to statin myalgias and instead switch to simvastatin which she has tolerated in the past..Consider possible participation in the previous stroke prevention trial if interested. Followup in the future with my nurse practitioner Kimberly Watts in  6 months or call earlier if necessary  Stroke Prevention Some medical conditions and behaviors are associated with a higher chance of having a stroke. You can help prevent a stroke by making nutrition, lifestyle, and other changes, including managing any medical conditions you may have. What nutrition changes can be made?  Eat healthy foods. You can do this by: ? Choosing foods high in fiber, such as fresh fruits and vegetables and whole grains. ? Eating at least 5 or more servings of fruits and vegetables a day. Try to fill half of your plate at each meal with fruits and vegetables. ? Choosing lean protein foods, such as lean cuts of meat, poultry without skin, fish, tofu, beans, and nuts. ? Eating low-fat dairy products. ? Avoiding foods that are high in salt (sodium). This can help lower blood pressure. ? Avoiding foods that have saturated fat, trans fat, and cholesterol. This can help prevent high cholesterol. ? Avoiding processed and premade foods.  Follow your health care provider's specific guidelines for losing weight, controlling high blood pressure  (hypertension), lowering high cholesterol, and managing diabetes. These may include: ? Reducing your daily calorie intake. ? Limiting your daily sodium intake to 1,500 milligrams (mg). ? Using only healthy fats for cooking, such as olive oil, canola oil, or sunflower oil. ? Counting your daily carbohydrate intake. What lifestyle changes can be made?  Maintain a healthy weight. Talk to your health care provider about your ideal weight.  Get at least 30 minutes of moderate physical activity at least 5 days a week. Moderate activity includes brisk walking, biking, and swimming.  Do not use any products that contain nicotine or tobacco, such as cigarettes and e-cigarettes. If you need help quitting, ask your health care provider. It may also be helpful to avoid exposure to secondhand smoke.  Limit alcohol intake to no more than 1 drink a day for nonpregnant women and 2 drinks a day for men. One drink equals 12 oz of beer, 5 oz of wine, or 1 oz of hard liquor.  Stop any illegal drug use.  Avoid taking birth control pills. Talk to your health care provider about the risks of taking birth control pills if: ? You are over 58 years old. ? You smoke. ? You get migraines. ? You have ever had a blood clot. What other changes can be made?  Manage your cholesterol levels. ? Eating a healthy diet is important for preventing high cholesterol. If cholesterol cannot be managed through diet alone, you may also need to take medicines. ? Take any prescribed medicines to control your cholesterol as told by your health care provider.  Manage your diabetes. ? Eating a healthy diet and exercising regularly are important parts of managing your blood sugar. If your  blood sugar cannot be managed through diet and exercise, you may need to take medicines. ? Take any prescribed medicines to control your diabetes as told by your health care provider.  Control your hypertension. ? To reduce your risk of stroke, try  to keep your blood pressure below 130/80. ? Eating a healthy diet and exercising regularly are an important part of controlling your blood pressure. If your blood pressure cannot be managed through diet and exercise, you may need to take medicines. ? Take any prescribed medicines to control hypertension as told by your health care provider. ? Ask your health care provider if you should monitor your blood pressure at home. ? Have your blood pressure checked every year, even if your blood pressure is normal. Blood pressure increases with age and some medical conditions.  Get evaluated for sleep disorders (sleep apnea). Talk to your health care provider about getting a sleep evaluation if you snore a lot or have excessive sleepiness.  Take over-the-counter and prescription medicines only as told by your health care provider. Aspirin or blood thinners (antiplatelets or anticoagulants) may be recommended to reduce your risk of forming blood clots that can lead to stroke.  Make sure that any other medical conditions you have, such as atrial fibrillation or atherosclerosis, are managed. What are the warning signs of a stroke? The warning signs of a stroke can be easily remembered as BEFAST.  B is for balance. Signs include: ? Dizziness. ? Loss of balance or coordination. ? Sudden trouble walking.  E is for eyes. Signs include: ? A sudden change in vision. ? Trouble seeing.  F is for face. Signs include: ? Sudden weakness or numbness of the face. ? The face or eyelid drooping to one side.  A is for arms. Signs include: ? Sudden weakness or numbness of the arm, usually on one side of the body.  S is for speech. Signs include: ? Trouble speaking (aphasia). ? Trouble understanding.  T is for time. ? These symptoms may represent a serious problem that is an emergency. Do not wait to see if the symptoms will go away. Get medical help right away. Call your local emergency services (911 in the  [place] Do not drive yourself to the hospital.  Other signs of stroke may include: ? A sudden, severe headache with no known cause. ? Nausea or vomiting. ? Seizure.  Where to find more information: For more information, visit:  American Stroke Association: www.strokeassociation.org  National Stroke Association: www.stroke.org  Summary  You can prevent a stroke by eating healthy, exercising, not smoking, limiting alcohol intake, and managing any medical conditions you may have.  Do not use any products that contain nicotine or tobacco, such as cigarettes and e-cigarettes. If you need help quitting, ask your health care provider. It may also be helpful to avoid exposure to secondhand smoke.  Remember BEFAST for warning signs of stroke. Get help right away if you or a loved one has any of these signs. This information is not intended to replace advice given to you by your health care provider. Make sure you discuss any questions you have with your health care provider. Document Released: 01/08/2005 Document Revised: 01/06/2017 Document Reviewed: 01/06/2017 Elsevier Interactive Patient Education  [employer].

## 2018-04-01 ENCOUNTER — Ambulatory Visit (INDEPENDENT_AMBULATORY_CARE_PROVIDER_SITE_OTHER): Payer: Managed Care, Other (non HMO)

## 2018-04-01 ENCOUNTER — Ambulatory Visit: Payer: Managed Care, Other (non HMO) | Admitting: Podiatry

## 2018-04-01 DIAGNOSIS — M722 Plantar fascial fibromatosis: Secondary | ICD-10-CM

## 2018-04-01 DIAGNOSIS — M2012 Hallux valgus (acquired), left foot: Secondary | ICD-10-CM

## 2018-04-01 DIAGNOSIS — M2011 Hallux valgus (acquired), right foot: Secondary | ICD-10-CM

## 2018-04-01 MED ORDER — METHYLPREDNISOLONE 4 MG PO TBPK: ORAL_TABLET | ORAL | 0 refills | Status: DC

## 2018-04-01 NOTE — Patient Instructions (Signed)
Plantar Fasciitis Rehab Ask your health care provider which exercises are safe for you. Do exercises exactly as told by your health care provider and adjust them as directed. It is normal to feel mild stretching, pulling, tightness, or discomfort as you do these exercises, but you should stop right away if you feel sudden pain or your pain gets worse. Do not begin these exercises until told by your health care provider. Stretching and range of motion exercises These exercises warm up your muscles and joints and improve the movement and flexibility of your foot. These exercises also help to relieve pain. Exercise A: Plantar fascia stretch  1. Sit with your left / right leg crossed over your opposite knee. 2. Hold your heel with one hand with that thumb near your arch. With your other hand, hold your toes and gently pull them back toward the top of your foot. You should feel a stretch on the bottom of your toes or your foot or both. 3. Hold this stretch for__________ seconds. 4. Slowly release your toes and return to the starting position. Repeat __________ times. Complete this exercise __________ times a day. Exercise B: Gastroc, standing  1. Stand with your hands against a wall. 2. Extend your left / right leg behind you, and bend your front knee slightly. 3. Keeping your heels on the floor and keeping your back knee straight, shift your weight toward the wall without arching your back. You should feel a gentle stretch in your left / right calf. 4. Hold this position for __________ seconds. Repeat __________ times. Complete this exercise __________ times a day. Exercise C: Soleus, standing 1. Stand with your hands against a wall. 2. Extend your left / right leg behind you, and bend your front knee slightly. 3. Keeping your heels on the floor, bend your back knee and slightly shift your weight over the back leg. You should feel a gentle stretch deep in your calf. 4. Hold this position for  __________ seconds. Repeat __________ times. Complete this exercise __________ times a day. Exercise D: Gastrocsoleus, standing 1. Stand with the ball of your left / right foot on a step. The ball of your foot is on the walking surface, right under your toes. 2. Keep your other foot firmly on the same step. 3. Hold onto the wall or a railing for balance. 4. Slowly lift your other foot, allowing your body weight to press your heel down over the edge of the step. You should feel a stretch in your left / right calf. 5. Hold this position for __________ seconds. 6. Return both feet to the step. 7. Repeat this exercise with a slight bend in your left / right knee. Repeat __________ times with your left / right knee straight and __________ times with your left / right knee bent. Complete this exercise __________ times a day. Balance exercise This exercise builds your balance and strength control of your arch to help take pressure off your plantar fascia. Exercise E: Single leg stand 1. Without shoes, stand near a railing or in a doorway. You may hold onto the railing or door frame as needed. 2. Stand on your left / right foot. Keep your big toe down on the floor and try to keep your arch lifted. Do not let your foot roll inward. 3. Hold this position for __________ seconds. 4. If this exercise is too easy, you can try it with your eyes closed or while standing on a pillow. Repeat __________ times. Complete   this exercise __________ times a day. This information is not intended to replace advice given to you by your health care provider. Make sure you discuss any questions you have with your health care provider. Document Released: 12/01/2005 Document Revised: 08/05/2016 Document Reviewed: 10/15/2015 Elsevier Interactive Patient Education  2018 Else[employer] Inc.   Bunion A bunion is a bump on the base of the big toe that forms when the bones of the big toe joint move out of position. Bunions may be  small at first, but they often get larger over time. The can make walking painful. What are the causes? A bunion may be caused by:  Wearing narrow or pointed shoes that force the big toe to press against the other toes.  Abnormal foot development that causes the foot to roll inward (pronate).  Changes in the foot that are caused by certain diseases, such as rheumatoid arthritis and polio.  A foot injury.  What increases the risk? The following factors may make you more likely to develop this condition:  Wearing shoes that squeeze the toes together.  Having certain diseases, such as: ? Rheumatoid arthritis. ? Polio. ? Cerebral palsy.  Having family members who have bunions.  Being born with a foot deformity, such as flat feet or low arches.  Doing activities that put a lot of pressure on the feet, such as ballet dancing.  What are the signs or symptoms? The main symptom of a bunion is a noticeable bump on the big toe. Other symptoms may include:  Pain.  Swelling around the big toe.  Redness and inflammation.  Thick or hardened skin on the big toe or between the toes.  Stiffness or loss of motion in the big toe.  Trouble with walking.  How is this diagnosed? A bunion may be diagnosed based on your symptoms, medical history, and activities. You may have tests, such as:  X-rays. These allow your health care provider to check the position of the bones in your foot and look for damage to your joint. They also help your health care provider to determine the severity of your bunion and the best way to treat it.  Joint aspiration. In this test, a sample of fluid is removed from the toe joint. This test, which may be done if you are in a lot of pain, helps to rule out diseases that cause painful swelling of the joints, such as arthritis.  How is this treated? There is no cure for a bunion, but treatment can help to prevent a bunion from getting worse. Treatment depends on the  severity of your symptoms. Your health care provider may recommend:  Wearing shoes that have a wide toe box.  Using bunion pads to cushion the affected area.  Taping your toes together to keep them in a normal position.  Placing a device inside your shoe (orthotics) to help reduce pressure on your toe joint.  Taking medicine to ease pain, inflammation, and swelling.  Applying heat or ice to the affected area.  Doing stretching exercises.  Surgery to remove scar tissue and move the toes back into their normal position. This treatment is rare.  Follow these instructions at home:  Support your toe joint with proper footwear, shoe padding, or taping as told by your health care provider.  Take over-the-counter and prescription medicines only as told by your health care provider.  If directed, apply ice to the injured area: ? Put ice in a plastic bag. ? Place a towel between   your skin and the bag. ? Leave the ice on for 20 minutes, 2-3 times per day.  If directed, apply heat to the affected area before you exercise. Use the heat source that your health care provider recommends, such as a moist heat pack or a heating pad. ? Place a towel between your skin and the heat source. ? Leave the heat on for 20-30 minutes. ? Remove the heat if your skin turns bright red. This is especially important if you are unable to feel pain, heat, or cold. You may have a greater risk of getting burned.  Do exercises as told by your health care provider.  Keep all follow-up visits as told by your health care provider. Contact a health care provider if:  Your symptoms get worse.  Your symptoms do not improve in 2 weeks. Get help right away if:  You have severe pain and trouble with walking. This information is not intended to replace advice given to you by your health care provider. Make sure you discuss any questions you have with your health care provider. Document Released: 12/01/2005 Document  Revised: 05/08/2016 Document Reviewed: 07/01/2015 Elsevier Interactive Patient Education  2018 Elsevier Inc.  

## 2018-04-05 NOTE — Progress Notes (Signed)
Subjective:   Patient ID: Kimberly Watts, female   DOB: 57 y.o.   MRN: [id number]   HPI 57 year old female presents the office today for concerns of bilateral heel pain which is been ongoing for about 1 month with the right side worse than the left.  She states that it hurts more the bottom of the heels and she states that she has a lot of pain when she goes without shoes.  She said no recent treatment for this.  She describes a throbbing sensation.  Denies any recent injury or trauma.   The pain does not wake her up at night.  She also has secondary concerns of bunions to both of her feet which are occasionally painful with pressure in shoes.  No recent injury to this area.   Review of Systems  All other systems reviewed and are negative.   Past Medical History:  Diagnosis Date  . GERD (gastroesophageal reflux disease)   . High cholesterol   . Hypertension   . Thyroid disease     Past Surgical History:  Procedure Laterality Date  . BREAST REDUCTION SURGERY       Current Outpatient Medications:  .  amLODipine-benazepril (LOTREL) 5-10 MG per capsule, Take 1 capsule by mouth daily., Disp: , Rfl:  .  atenolol (TENORMIN) 25 MG tablet, Take 25 mg by mouth daily., Disp: , Rfl:  .  calcium-vitamin D (OSCAL WITH D) 500-200 MG-UNIT per tablet, Take 1 tablet by mouth daily., Disp: , Rfl:  .  clopidogrel (PLAVIX) 75 MG tablet, Take 1 tablet (75 mg total) by mouth daily., Disp: 30 tablet, Rfl: 3 .  dicyclomine (BENTYL) 20 MG tablet, Take 20 mg by mouth every 6 (six) hours as needed. Abdominal cramps, Disp: , Rfl: 0 .  levothyroxine (SYNTHROID, LEVOTHROID) 50 MCG tablet, Take 50 mcg by mouth at bedtime., Disp: , Rfl: 1 .  meclizine (ANTIVERT) 25 MG tablet, Take 1 tablet (25 mg total) by mouth every 6 (six) hours as needed for dizziness., Disp: 28 tablet, Rfl: 0 .  methylPREDNISolone (MEDROL DOSEPAK) 4 MG TBPK tablet, Take as directed, Disp: 21 tablet, Rfl: 0 .  omeprazole (PRILOSEC) 20 MG  capsule, Take 20 mg by mouth daily., Disp: , Rfl:  .  simvastatin (ZOCOR) 40 MG tablet, Take 1 tablet (40 mg total) by mouth daily., Disp: 30 tablet, Rfl: 3  No Known Allergies  Social History   Socioeconomic History  . Marital status: Married    Spouse name: Not on file  . Number of children: Not on file  . Years of education: Not on file  . Highest education level: Not on file  Occupational History  . Not on file  Social Needs  . Financial resource strain: Not on file  . Food insecurity:    Worry: Not on file    Inability: Not on file  . Transportation needs:    Medical: Not on file    Non-medical: Not on file  Tobacco Use  . Smoking status: Never Smoker  . Smokeless tobacco: Never Used  Substance and Sexual Activity  . Alcohol use: No  . Drug use: No  . Sexual activity: Not on file  Lifestyle  . Physical activity:    Days per week: Not on file    Minutes per session: Not on file  . Stress: Not on file  Relationships  . Social connections:    Talks on phone: Not on file    Gets together: Not on file  Attends religious service: Not on file    Active member of club or organization: Not on file    Attends meetings of clubs or organizations: Not on file    Relationship status: Not on file  . Intimate partner violence:    Fear of current or ex partner: Not on file    Emotionally abused: Not on file    Physically abused: Not on file    Forced sexual activity: Not on file  Other Topics Concern  . Not on file  Social History Narrative  . Not on file         Objective:  Physical Exam  General: AAO x3, NAD  Dermatological: Skin is warm, dry and supple bilateral. Nails x 10 are well manicured; remaining integument appears unremarkable at this time. There are no open sores, no preulcerative lesions, no rash or signs of infection present.  Vascular: Dorsalis Pedis artery and Posterior Tibial artery pedal pulses are 2/4 bilateral with immedate capillary fill  time.There is no pain with calf compression, swelling, warmth, erythema.   Neruologic: Grossly intact via light touch bilateral. Vibratory intact via tuning fork bilateral. Protective threshold with Semmes Wienstein monofilament intact to all pedal sites bilateral. Negative tinel sign b/l.   Musculoskeletal: Tenderness to palpation along the plantar medial tubercle of the calcaneus at the insertion of plantar fascia on the right > left foot. There is no pain along the course of the plantar fascia within the arch of the foot. Plantar fascia appears to be intact. There is no pain with lateral compression of the calcaneus or pain with vibratory sensation. There is no pain along the course or insertion of the achilles tendon.  HAV is present bilaterally there is minimal tenderness palpation along the bunion site.  There is no pain or crepitation with MPJ range of motion there is no first ray hypomobility present.  No other areas of tenderness to bilateral lower extremities. Muscular strength 5/5 in all groups tested bilateral.     Assessment:   Bilateral heel pain, plantar fasciitis; HAV bilaterally      Plan:  -Treatment options discussed including all alternatives, risks, and complications -Etiology of symptoms were discussed -X-rays were obtained and reviewed with the patient.  There is no definitive evidence of acute fracture or stress fracture identified at this time. -On steroid injection.  I prescribed a Medrol Dosepak. -Plantar fascial brace dispensed x2 -Stretching, icing exercises daily -We discussed shoe modifications and orthotics -On exam today she states that her bunions have become painful.  She is asking about possible surgery for the bunions.  Discussed conservative as well as surgical treatment options.  For now we will continue conservative treatment including shoe modifications, offloading and padding.  She will consider surgery for the bunions in the future. -Follow-up in 4  weeks or sooner if needed.  Call any questions or concerns.  Vivi Barrack DPM

## 2018-04-28 ENCOUNTER — Telehealth: Payer: Self-pay | Admitting: Neurology

## 2018-04-28 ENCOUNTER — Other Ambulatory Visit: Payer: Self-pay

## 2018-04-28 MED ORDER — SIMVASTATIN 80 MG PO TABS: 80.0000 mg | ORAL_TABLET | Freq: Every day | ORAL | 1 refills | Status: DC

## 2018-04-28 NOTE — Telephone Encounter (Signed)
Rn call patient about her zocor dosage. Rn stated per Dr. Pearlean Brownie he will do . Rn stated she can take 2pills of  until its finish,and start the . RN stated rx was sent to her pharmacy.

## 2018-04-28 NOTE — Telephone Encounter (Signed)
Okay to refill simvastatin 80 mg and kindly fax this   note to Dr. Manus Gunning to let him know that patient had trouble tolerating atorvastatin and prefers simvastatin instead

## 2018-04-28 NOTE — Telephone Encounter (Signed)
Patient requesting a refill for simvastatin (ZOCOR)  tablet #90 sent to [facility] on [address]. When she was hospitalized for a stroke they put her on Atorvistatin for cholesterol but the medication was making her muscles ache so medication was changed to Simvastatin.She called her PCP Dr. Blair Heys for a refill but was told since hospital changed medication they would need a note sent to them from Dr. Pearlean Brownie advising why she cannot take Atorvistatin and is now taking Simvastatin.

## 2018-05-03 ENCOUNTER — Ambulatory Visit: Payer: Managed Care, Other (non HMO) | Admitting: Podiatry

## 2018-05-03 DIAGNOSIS — M2011 Hallux valgus (acquired), right foot: Secondary | ICD-10-CM

## 2018-05-03 DIAGNOSIS — M2012 Hallux valgus (acquired), left foot: Secondary | ICD-10-CM | POA: Diagnosis not present

## 2018-05-03 DIAGNOSIS — M722 Plantar fascial fibromatosis: Secondary | ICD-10-CM | POA: Diagnosis not present

## 2018-05-03 MED ORDER — TRIAMCINOLONE ACETONIDE 10 MG/ML IJ SUSP
10.0000 mg | Freq: Once | INTRAMUSCULAR | Status: AC
  Administered 2018-05-03: 10 mg

## 2018-05-03 NOTE — Progress Notes (Signed)
Subjective: 57 year old female presents the office for follow-up evaluation of bilateral heel pain with the right side worse than left.  She states that she is minimal pain on the left side but she still has some pain in the bottom of the right side but overall it has gotten better.  She is also been having some more pain to the left bunion and she was to discuss treatment options for this as well.  No recent injury or changes since I last saw her. Denies any systemic complaints such as fevers, chills, nausea, vomiting. No acute changes since last appointment, and no other complaints at this time.   Objective: AAO x3, NAD DP/PT pulses palpable bilaterally, CRT less than 3 seconds There is tenderness palpation on the plantar medial tubercle of the calcaneus at the insertion of the plantar fascia on the right foot.  No pain on the course the plantar fascial the arch of the foot.  Plantar fascia appears to be intact.  Achilles tendon intact.  No other areas of tenderness on the right foot.  HAV is present bilaterally the left side worse than the right and there is mild tenderness palpation gently on the bunion site.  Decreased medial arch height.  There is no pain or crepitation with first MPJ range of motion.  No first ray hypermobility present.  No open lesions or pre-ulcerative lesions.  No pain with calf compression, swelling, warmth, erythema  Assessment: Right plantar fasciitis, left bunion  Plan: -All treatment options discussed with the patient including all alternatives, risks, complications.   1. Right plantar fasciitis > left  -Steroid injection performed.  See procedure note below. -Discussed stretching, icing exercises on a regular basis.  Continue with plantar fascial brace.  Discussed supportive shoes as well as orthotics.  Procedure: Injection Tendon/Ligament Discussed alternatives, risks, complications and verbal consent was obtained.  Location: Right plantar fascia at the glabrous  junction; medial approach. Skin Prep: Alcohol. Injectate: 0.5cc 0.5% marcaine plain, 0.5 cc 2% lidocaine plain and, 1 cc kenalog 10. Disposition: Patient tolerated procedure well. Injection site dressed with a band-aid.  Post-injection care was discussed and return precautions discussed.    2. Left HAV -Also the orthotics will be beneficial for this as well as padding and shoe gear modifications to take pressure off the area.  Did discuss surgical intervention including Austin bunionectomy with screw fixation if symptoms continue.  We discussed the surgery as well as the postoperative course and she will consider her options.  -Patient encouraged to call the office with any questions, concerns, change in symptoms.   Vivi Barrack DPM

## 2018-05-03 NOTE — Patient Instructions (Signed)
I would look at getting some arch supports for your shoes and changing your shoe to give more support. I like [employer] on [address] or [employer] on [address].   Have a great week   Plantar Fasciitis (Heel Spur Syndrome) with Rehab The plantar fascia is a fibrous, ligament-like, soft-tissue structure that spans the bottom of the foot. Plantar fasciitis is a condition that causes pain in the foot due to inflammation of the tissue. SYMPTOMS   Pain and tenderness on the underneath side of the foot.  Pain that worsens with standing or walking. CAUSES  Plantar fasciitis is caused by irritation and injury to the plantar fascia on the underneath side of the foot. Common mechanisms of injury include:  Direct trauma to bottom of the foot.  Damage to a small nerve that runs under the foot where the main fascia attaches to the heel bone.  Stress placed on the plantar fascia due to bone spurs. RISK INCREASES WITH:   Activities that place stress on the plantar fascia (running, jumping, pivoting, or cutting).  Poor strength and flexibility.  Improperly fitted shoes.  Tight calf muscles.  Flat feet.  Failure to warm-up properly before activity.  Obesity. PREVENTION  Warm up and stretch properly before activity.  Allow for adequate recovery between workouts.  Maintain physical fitness:  Strength, flexibility, and endurance.  Cardiovascular fitness.  Maintain a health body weight.  Avoid stress on the plantar fascia.  Wear properly fitted shoes, including arch supports for individuals who have flat feet.  PROGNOSIS  If treated properly, then the symptoms of plantar fasciitis usually resolve without surgery. However, occasionally surgery is necessary.  RELATED COMPLICATIONS   Recurrent symptoms that may result in a chronic condition.  Problems of the lower back that are caused by compensating for the injury, such as limping.  Pain or weakness of the foot during  push-off following surgery.  Chronic inflammation, scarring, and partial or complete fascia tear, occurring more often from repeated injections.  TREATMENT  Treatment initially involves the use of ice and medication to help reduce pain and inflammation. The use of strengthening and stretching exercises may help reduce pain with activity, especially stretches of the Achilles tendon. These exercises may be performed at home or with a therapist. Your caregiver may recommend that you use heel cups of arch supports to help reduce stress on the plantar fascia. Occasionally, corticosteroid injections are given to reduce inflammation. If symptoms persist for greater than 6 months despite non-surgical (conservative), then surgery may be recommended.   MEDICATION   If pain medication is necessary, then nonsteroidal anti-inflammatory medications, such as aspirin and ibuprofen, or other minor pain relievers, such as acetaminophen, are often recommended.  Do not take pain medication within 7 days before surgery.  Prescription pain relievers may be given if deemed necessary by your caregiver. Use only as directed and only as much as you need.  Corticosteroid injections may be given by your caregiver. These injections should be reserved for the most serious cases, because they may only be given a certain number of times.  HEAT AND COLD  Cold treatment (icing) relieves pain and reduces inflammation. Cold treatment should be applied for 10 to 15 minutes every 2 to 3 hours for inflammation and pain and immediately after any activity that aggravates your symptoms. Use ice packs or massage the area with a piece of ice (ice massage).  Heat treatment may be used prior to performing the stretching and strengthening activities prescribed by  your caregiver, physical therapist, or athletic trainer. Use a heat pack or soak the injury in warm water.  SEEK IMMEDIATE MEDICAL CARE IF:  Treatment seems to offer no benefit,  or the condition worsens.  Any medications produce adverse side effects.  EXERCISES- RANGE OF MOTION (ROM) AND STRETCHING EXERCISES - Plantar Fasciitis (Heel Spur Syndrome) These exercises may help you when beginning to rehabilitate your injury. Your symptoms may resolve with or without further involvement from your physician, physical therapist or athletic trainer. While completing these exercises, remember:   Restoring tissue flexibility helps normal motion to return to the joints. This allows healthier, less painful movement and activity.  An effective stretch should be held for at least 30 seconds.  A stretch should never be painful. You should only feel a gentle lengthening or release in the stretched tissue.  RANGE OF MOTION - Toe Extension, Flexion  Sit with your right / left leg crossed over your opposite knee.  Grasp your toes and gently pull them back toward the top of your foot. You should feel a stretch on the bottom of your toes and/or foot.  Hold this stretch for 10 seconds.  Now, gently pull your toes toward the bottom of your foot. You should feel a stretch on the top of your toes and or foot.  Hold this stretch for 10 seconds. Repeat  times. Complete this stretch 3 times per day.   RANGE OF MOTION - Ankle Dorsiflexion, Active Assisted  Remove shoes and sit on a chair that is preferably not on a carpeted surface.  Place right / left foot under knee. Extend your opposite leg for support.  Keeping your heel down, slide your right / left foot back toward the chair until you feel a stretch at your ankle or calf. If you do not feel a stretch, slide your bottom forward to the edge of the chair, while still keeping your heel down.  Hold this stretch for 10 seconds. Repeat 3 times. Complete this stretch 2 times per day.   STRETCH  Gastroc, Standing  Place hands on wall.  Extend right / left leg, keeping the front knee somewhat bent.  Slightly point your toes inward  on your back foot.  Keeping your right / left heel on the floor and your knee straight, shift your weight toward the wall, not allowing your back to arch.  You should feel a gentle stretch in the right / left calf. Hold this position for 10 seconds. Repeat 3 times. Complete this stretch 2 times per day.  STRETCH  Soleus, Standing  Place hands on wall.  Extend right / left leg, keeping the other knee somewhat bent.  Slightly point your toes inward on your back foot.  Keep your right / left heel on the floor, bend your back knee, and slightly shift your weight over the back leg so that you feel a gentle stretch deep in your back calf.  Hold this position for 10 seconds. Repeat 3 times. Complete this stretch 2 times per day.  STRETCH  Gastrocsoleus, Standing  Note: This exercise can place a lot of stress on your foot and ankle. Please complete this exercise only if specifically instructed by your caregiver.   Place the ball of your right / left foot on a step, keeping your other foot firmly on the same step.  Hold on to the wall or a rail for balance.  Slowly lift your other foot, allowing your body weight to press your heel  down over the edge of the step.  You should feel a stretch in your right / left calf.  Hold this position for 10 seconds.  Repeat this exercise with a slight bend in your right / left knee. Repeat 3 times. Complete this stretch 2 times per day.   STRENGTHENING EXERCISES - Plantar Fasciitis (Heel Spur Syndrome)  These exercises may help you when beginning to rehabilitate your injury. They may resolve your symptoms with or without further involvement from your physician, physical therapist or athletic trainer. While completing these exercises, remember:   Muscles can gain both the endurance and the strength needed for everyday activities through controlled exercises.  Complete these exercises as instructed by your physician, physical therapist or athletic  trainer. Progress the resistance and repetitions only as guided.  STRENGTH - Towel Curls  Sit in a chair positioned on a non-carpeted surface.  Place your foot on a towel, keeping your heel on the floor.  Pull the towel toward your heel by only curling your toes. Keep your heel on the floor. Repeat 3 times. Complete this exercise 2 times per day.  STRENGTH - Ankle Inversion  Secure one end of a rubber exercise band/tubing to a fixed object (table, pole). Loop the other end around your foot just before your toes.  Place your fists between your knees. This will focus your strengthening at your ankle.  Slowly, pull your big toe up and in, making sure the band/tubing is positioned to resist the entire motion.  Hold this position for 10 seconds.  Have your muscles resist the band/tubing as it slowly pulls your foot back to the starting position. Repeat 3 times. Complete this exercises 2 times per day.  Document Released: 12/01/2005 Document Revised: 02/23/2012 Document Reviewed: 03/15/2009 [facility] Patient Information 2014 [place], [place].   Bunion A bunion is a bump on the base of the big toe that forms when the bones of the big toe joint move out of position. Bunions may be small at first, but they often get larger over time. The can make walking painful. What are the causes? A bunion may be caused by:  Wearing narrow or pointed shoes that force the big toe to press against the other toes.  Abnormal foot development that causes the foot to roll inward (pronate).  Changes in the foot that are caused by certain diseases, such as rheumatoid arthritis and polio.  A foot injury.  What increases the risk? The following factors may make you more likely to develop this condition:  Wearing shoes that squeeze the toes together.  Having certain diseases, such as: ? Rheumatoid arthritis. ? Polio. ? Cerebral palsy.  Having family members who have bunions.  Being born with a foot  deformity, such as flat feet or low arches.  Doing activities that put a lot of pressure on the feet, such as ballet dancing.  What are the signs or symptoms? The main symptom of a bunion is a noticeable bump on the big toe. Other symptoms may include:  Pain.  Swelling around the big toe.  Redness and inflammation.  Thick or hardened skin on the big toe or between the toes.  Stiffness or loss of motion in the big toe.  Trouble with walking.  How is this diagnosed? A bunion may be diagnosed based on your symptoms, medical history, and activities. You may have tests, such as:  X-rays. These allow your health care provider to check the position of the bones in your foot and  look for damage to your joint. They also help your health care provider to determine the severity of your bunion and the best way to treat it.  Joint aspiration. In this test, a sample of fluid is removed from the toe joint. This test, which may be done if you are in a lot of pain, helps to rule out diseases that cause painful swelling of the joints, such as arthritis.  How is this treated? There is no cure for a bunion, but treatment can help to prevent a bunion from getting worse. Treatment depends on the severity of your symptoms. Your health care provider may recommend:  Wearing shoes that have a wide toe box.  Using bunion pads to cushion the affected area.  Taping your toes together to keep them in a normal position.  Placing a device inside your shoe (orthotics) to help reduce pressure on your toe joint.  Taking medicine to ease pain, inflammation, and swelling.  Applying heat or ice to the affected area.  Doing stretching exercises.  Surgery to remove scar tissue and move the toes back into their normal position. This treatment is rare.  Follow these instructions at home:  Support your toe joint with proper footwear, shoe padding, or taping as told by your health care provider.  Take  over-the-counter and prescription medicines only as told by your health care provider.  If directed, apply ice to the injured area: ? Put ice in a plastic bag. ? Place a towel between your skin and the bag. ? Leave the ice on for 20 minutes, 2-3 times per day.  If directed, apply heat to the affected area before you exercise. Use the heat source that your health care provider recommends, such as a moist heat pack or a heating pad. ? Place a towel between your skin and the heat source. ? Leave the heat on for 20-30 minutes. ? Remove the heat if your skin turns bright red. This is especially important if you are unable to feel pain, heat, or cold. You may have a greater risk of getting burned.  Do exercises as told by your health care provider.  Keep all follow-up visits as told by your health care provider. Contact a health care provider if:  Your symptoms get worse.  Your symptoms do not improve in 2 weeks. Get help right away if:  You have severe pain and trouble with walking. This information is not intended to replace advice given to you by your health care provider. Make sure you discuss any questions you have with your health care provider. Document Released: 12/01/2005 Document Revised: 05/08/2016 Document Reviewed: 07/01/2015 [employer] Interactive Patient Education  [employer].

## 2018-05-12 ENCOUNTER — Encounter (HOSPITAL_BASED_OUTPATIENT_CLINIC_OR_DEPARTMENT_OTHER): Payer: Self-pay | Admitting: Emergency Medicine

## 2018-05-12 ENCOUNTER — Emergency Department (HOSPITAL_BASED_OUTPATIENT_CLINIC_OR_DEPARTMENT_OTHER): Payer: Managed Care, Other (non HMO)

## 2018-05-12 ENCOUNTER — Emergency Department (HOSPITAL_BASED_OUTPATIENT_CLINIC_OR_DEPARTMENT_OTHER)
Admission: EM | Admit: 2018-05-12 | Discharge: 2018-05-12 | Disposition: A | Discharge status: Home / Self Care | Payer: Managed Care, Other (non HMO) | Attending: Emergency Medicine | Admitting: Emergency Medicine

## 2018-05-12 ENCOUNTER — Other Ambulatory Visit: Payer: Self-pay

## 2018-05-12 DIAGNOSIS — I10 Essential (primary) hypertension: Secondary | ICD-10-CM | POA: Diagnosis not present

## 2018-05-12 DIAGNOSIS — R0602 Shortness of breath: Secondary | ICD-10-CM | POA: Diagnosis present

## 2018-05-12 DIAGNOSIS — Z8673 Personal history of transient ischemic attack (TIA), and cerebral infarction without residual deficits: Secondary | ICD-10-CM | POA: Diagnosis not present

## 2018-05-12 DIAGNOSIS — Z79899 Other long term (current) drug therapy: Secondary | ICD-10-CM | POA: Diagnosis not present

## 2018-05-12 DIAGNOSIS — E039 Hypothyroidism, unspecified: Secondary | ICD-10-CM | POA: Diagnosis not present

## 2018-05-12 DIAGNOSIS — Z7902 Long term (current) use of antithrombotics/antiplatelets: Secondary | ICD-10-CM | POA: Insufficient documentation

## 2018-05-12 DIAGNOSIS — J189 Pneumonia, unspecified organism: Secondary | ICD-10-CM | POA: Diagnosis not present

## 2018-05-12 LAB — CBC WITH DIFFERENTIAL/PLATELET
BASOS ABS: 0 10*3/uL (ref 0.0–0.1)
Basophils Relative: 0 %
EOS PCT: 1 %
Eosinophils Absolute: 0.1 10*3/uL (ref 0.0–0.7)
HEMATOCRIT: 43.1 % (ref 36.0–46.0)
Hemoglobin: 14.4 g/dL (ref 12.0–15.0)
LYMPHS ABS: 1.6 10*3/uL (ref 0.7–4.0)
LYMPHS PCT: 17 %
MCH: 32.5 pg (ref 26.0–34.0)
MCHC: 33.4 g/dL (ref 30.0–36.0)
MCV: 97.3 fL (ref 78.0–100.0)
MONO ABS: 0.8 10*3/uL (ref 0.1–1.0)
MONOS PCT: 9 %
Neutro Abs: 6.7 10*3/uL (ref 1.7–7.7)
Neutrophils Relative %: 73 %
PLATELETS: 251 10*3/uL (ref 150–400)
RBC: 4.43 MIL/uL (ref 3.87–5.11)
RDW: 13.7 % (ref 11.5–15.5)
WBC: 9.3 10*3/uL (ref 4.0–10.5)

## 2018-05-12 LAB — BASIC METABOLIC PANEL
Anion gap: 10 (ref 5–15)
BUN: 14 mg/dL (ref 6–20)
CO2: 26 mmol/L (ref 22–32)
Calcium: 9.5 mg/dL (ref 8.9–10.3)
Chloride: 104 mmol/L (ref 101–111)
Creatinine, Ser: 0.98 mg/dL (ref 0.44–1.00)
GFR calc Af Amer: >60 mL/min (ref >60)
GLUCOSE: 124 mg/dL — AB (ref 65–99)
POTASSIUM: 4.1 mmol/L (ref 3.5–5.1)
Sodium: 140 mmol/L (ref 135–145)

## 2018-05-12 LAB — BRAIN NATRIURETIC PEPTIDE: B Natriuretic Peptide: 10.3 pg/mL (ref 0.0–100.0)

## 2018-05-12 LAB — D-DIMER, QUANTITATIVE: D-Dimer, Quant: 0.44 ug/mL-FEU (ref 0.00–0.50)

## 2018-05-12 LAB — TROPONIN I: Troponin I: <0.03 ng/mL (ref <0.03)

## 2018-05-12 MED ORDER — AZITHROMYCIN 250 MG PO TABS: ORAL_TABLET | ORAL | 0 refills | Status: DC

## 2018-05-12 NOTE — ED Triage Notes (Signed)
Pt reports shortness of breath and cough x 2 weeks. Pt was seen by UC and given prednisone, doxycyline and inhaler. Pt reports no improvement.

## 2018-05-12 NOTE — ED Provider Notes (Signed)
[facility] EMERGENCY DEPARTMENT Provider Note   CSN: [id number] Arrival date & time: 05/12/18  0347     History   Chief Complaint Chief Complaint  Patient presents with  . Shortness of Breath    HPI Kimberly Watts is a 57 y.o. female.  This patient is a 57 year old female with past medical history of hypertension, high cholesterol, GERD.  She presents today for evaluation of shortness of breath.  This is been present for the past week.  She has been seen at urgent care twice with similar complaints.  She was prescribed steroids and antibiotics, however neither of these have helped.  She feels congested and has a cough, but denies fever.  She denies any chest pain.  The history is provided by the patient.  Shortness of Breath  This is a new problem. The average episode lasts 1 week. The problem occurs continuously.The problem has been gradually worsening. Pertinent negatives include no fever, no sore throat, no sputum production and no chest pain. She has tried oral steroids and beta-agonist inhalers (doxycycline) for the symptoms. The treatment provided no relief.    Past Medical History:  Diagnosis Date  . GERD (gastroesophageal reflux disease)   . High cholesterol   . Hypertension   . Thyroid disease     Patient Active Problem List   Diagnosis Date Noted  . TIA (transient ischemic attack) 12/22/2017  . Essential hypertension 12/22/2017  . Hypothyroidism 12/22/2017  . Weakness 12/21/2017  . Acute cystitis without hematuria   . ACUTE PANCREATITIS 08/23/2009    Past Surgical History:  Procedure Laterality Date  . BREAST REDUCTION SURGERY       OB History   None      Home Medications    Prior to Admission medications   Medication Sig Start Date End Date Taking? Authorizing Provider  amLODipine-benazepril (LOTREL) 5-10 MG per capsule Take 1 capsule by mouth daily.    [provider]  atenolol (TENORMIN) 25 MG tablet Take 25 mg by mouth  daily.    [provider]  calcium-vitamin D (OSCAL WITH D) 500-200 MG-UNIT per tablet Take 1 tablet by mouth daily.    [provider]  clopidogrel (PLAVIX) 75 MG tablet Take 1 tablet (75 mg total) by mouth daily. 12/22/17   Rai, Delene Ruffini, MD  dicyclomine (BENTYL) 20 MG tablet Take 20 mg by mouth every 6 (six) hours as needed. Abdominal cramps 12/18/17   [provider]  levothyroxine (SYNTHROID, LEVOTHROID) 50 MCG tablet Take 50 mcg by mouth at bedtime. 10/11/17   [provider]  meclizine (ANTIVERT) 25 MG tablet Take 1 tablet (25 mg total) by mouth every 6 (six) hours as needed for dizziness. 09/12/14   Pricilla Loveless, MD  methylPREDNISolone (MEDROL DOSEPAK) 4 MG TBPK tablet Take as directed 04/01/18   Vivi Barrack, DPM  omeprazole (PRILOSEC) 20 MG capsule Take 20 mg by mouth daily.    [provider]  simvastatin (ZOCOR) 80 MG tablet Take 1 tablet (80 mg total) by mouth daily. 04/28/18   Micki Riley, MD    Family History Family History  Problem Relation Age of Onset  . Stroke Mother   . Diabetes Mother   . Hypertension Mother   . Diabetes Father   . Hypertension Father   . Heart attack Father     Social History Social History   Tobacco Use  . Smoking status: Never Smoker  . Smokeless tobacco: Never Used  Substance Use  Topics  . Alcohol use: No  . Drug use: No     Allergies   Patient has no known allergies.   Review of Systems Review of Systems  Constitutional: Negative for fever.  HENT: Negative for sore throat.   Respiratory: Positive for shortness of breath. Negative for sputum production.   Cardiovascular: Negative for chest pain.  All other systems reviewed and are negative.    Physical Exam Updated Vital Signs BP (!) 149/94 (BP Location: Left Arm)   Pulse 61   Temp 98.3 F (36.8 C) (Oral)   Resp 20   Ht  (1.651 m)   Wt 125.6 kg (277 lb)   SpO2 94%   BMI 46.10 kg/m   Physical Exam    Constitutional: She is oriented to person, place, and time. She appears well-developed and well-nourished. No distress.  HENT:  Head: Normocephalic and atraumatic.  Neck: Normal range of motion. Neck supple.  Cardiovascular: Normal rate and regular rhythm. Exam reveals no gallop and no friction rub.  No murmur heard. Pulmonary/Chest: Effort normal and breath sounds normal. No respiratory distress. She has no wheezes.  Abdominal: Soft. Bowel sounds are normal. She exhibits no distension. There is no tenderness.  Musculoskeletal: Normal range of motion.  Neurological: She is alert and oriented to person, place, and time.  Skin: Skin is warm and dry. She is not diaphoretic.  Nursing note and vitals reviewed.    ED Treatments / Results  Labs (all labs ordered are listed, but only abnormal results are displayed) Labs Reviewed  BASIC METABOLIC PANEL  CBC WITH DIFFERENTIAL/PLATELET  BRAIN NATRIURETIC PEPTIDE  TROPONIN I    EKG None  Radiology No results found.  Procedures Procedures (including critical care time)  Medications Ordered in ED Medications - No data to display   Initial Impression / Assessment and Plan / ED Course  I have reviewed the triage vital signs and the nursing notes.  Pertinent labs & imaging results that were available during my care of the patient were reviewed by me and considered in my medical decision making (see chart for details).  Patient with shortness of breath that started one week ago.  She is no better with steroids and doxycycline.  The workup today shows only small opacities in the bibasilar lobes.  She will be prescribed Zithromax and is to follow-up as needed.  D-dimer is negative and BNP is normal.  Final Clinical Impressions(s) / ED Diagnoses   Final diagnoses:  None    ED Discharge Orders    None       Geoffery Lyons, MD 05/12/18 0700

## 2018-05-12 NOTE — ED Notes (Signed)
Patient transported to X-ray 

## 2018-05-12 NOTE — Discharge Instructions (Addendum)
Zithromax as prescribed.  Follow-up with your primary doctor if not improving in the next week, and return to the ER if symptoms significantly worsen or change.

## 2018-05-12 NOTE — ED Triage Notes (Signed)
Pt was seen yesterday at [facility] in clinic and had normal EKG. Pt had chest xray last week at [facility].

## 2018-05-17 ENCOUNTER — Other Ambulatory Visit (HOSPITAL_COMMUNITY)
Admission: RE | Admit: 2018-05-17 | Discharge: 2018-05-17 | Disposition: A | Discharge status: Home / Self Care | Payer: Managed Care, Other (non HMO) | Source: Ambulatory Visit | Attending: Obstetrics and Gynecology | Admitting: Obstetrics and Gynecology

## 2018-05-17 ENCOUNTER — Other Ambulatory Visit: Payer: Self-pay | Admitting: Obstetrics and Gynecology

## 2018-05-17 DIAGNOSIS — Z124 Encounter for screening for malignant neoplasm of cervix: Secondary | ICD-10-CM | POA: Insufficient documentation

## 2018-05-18 LAB — CYTOLOGY - PAP
Diagnosis: NEGATIVE
HPV (WINDOPATH): NOT DETECTED

## 2018-05-19 ENCOUNTER — Other Ambulatory Visit: Payer: Self-pay | Admitting: Obstetrics and Gynecology

## 2018-05-19 DIAGNOSIS — Z1231 Encounter for screening mammogram for malignant neoplasm of breast: Secondary | ICD-10-CM

## 2018-05-24 ENCOUNTER — Ambulatory Visit: Payer: Managed Care, Other (non HMO) | Admitting: Podiatry

## 2018-06-01 ENCOUNTER — Ambulatory Visit: Payer: Managed Care, Other (non HMO) | Admitting: Podiatry

## 2018-06-08 ENCOUNTER — Ambulatory Visit
Admission: RE | Admit: 2018-06-08 | Discharge: 2018-06-08 | Disposition: A | Discharge status: Home / Self Care | Payer: Managed Care, Other (non HMO) | Source: Ambulatory Visit | Attending: Obstetrics and Gynecology | Admitting: Obstetrics and Gynecology

## 2018-06-08 DIAGNOSIS — Z1231 Encounter for screening mammogram for malignant neoplasm of breast: Secondary | ICD-10-CM

## 2018-08-26 ENCOUNTER — Ambulatory Visit: Payer: Managed Care, Other (non HMO) | Admitting: Adult Health

## 2018-08-26 ENCOUNTER — Encounter: Payer: Self-pay | Admitting: Adult Health

## 2018-08-26 ENCOUNTER — Telehealth: Payer: Self-pay | Admitting: Adult Health

## 2018-08-26 VITALS — BP 162/82 | HR 78 | Ht 65.0 in | Wt 283.0 lb

## 2018-08-26 DIAGNOSIS — I10 Essential (primary) hypertension: Secondary | ICD-10-CM | POA: Diagnosis not present

## 2018-08-26 DIAGNOSIS — G459 Transient cerebral ischemic attack, unspecified: Secondary | ICD-10-CM | POA: Diagnosis not present

## 2018-08-26 NOTE — Progress Notes (Signed)
[facility] [address] [place]. [place] [id number] [phone]       OFFICE FOLLOW-UP NOTE  Ms. Kern ReapLoretha J Redfield Date of Birth:  11/04/1961 Medical Record Number:  [id number]   HPI (initial visit 02/23/2018 PS): Ms Ladona Ridgelaylor is a [age] year African-American lady seen today in the office for follow-up visit following hospital admission for TIA in January 2019. History is obtained from the patient, review of electronic medical records and I have personally reviewed imaging films. Kern ReapLoretha J Coachman is a 57 y.o. female who has a past medical history of hypertension, hypercholesterolemia, hypothyroidism [facility] for evaluation of multiple days of progressively worsening lower abdominal pain and dysuria.  She was seen by her PCP and given antibiotics but her symptoms did not resolve.  She woke up last night around 3 in the morning feeling dizzy and had a 15-minute episode of what she described as slurred speech, weakness of the right arm greater than leg as well as tingling and numbness of the whole right hemibody, and cramp-like posturing of the right upper extremity that has nearly completely resolved at this time.  Because of these neurological symptoms, a neurological consultation was placed and she was admitted for a stroke/TIA workup. She had labs drawn which showed evidence of UTI, for which she is being treated at this time. She has never had any neurological symptoms prior to this presentation. She has a family history of strokes in her mother.  She has a family history of myocardial infarction-father died at the age of 58 from a heart attack.   CT scan of the head on admission was unremarkable an MRI scan of the brain also did not reveal an acute infarct. MRI of the brain and carotid ultrasound was unremarkable except for mild 40-59% right distal ICA stenosis. Transthoracic echo showed normal ejection fraction. LDL cholesterol was elevated at 115 mg  percent and hemoglobin A1c was 5.9. Patient was started on Plavix for stroke prevention and Lipitor. She states she is having trouble tolerating Lipitor due to muscle aches and pains in has stopped it from today. She states in the past she has tolerated simvastatin quite well and would like to go back on it. She states her blood pressure is well controlled though it is slightly borderline today at 145/80. She has no other new complaints today. She denies any prior history of TIA or strokes.  Interval history 08/26/2018: Patient is being seen today for follow-up visit.  Since prior appointment, patient was seen in the ED on 05/12/2018 with community-acquired pneumonia.  Overall she states that she is been doing well.  She continues to take Plavix without side effects of bleeding or bruising.  Continues to take simvastatin without side effects myalgias.  Initial blood pressure readings 167/92 on left arm and 145/80 on right arm both automatic.  Rechecked manual pressures with 182/108 on left arm and 162/82 on right arm.  Weaker radial pulse in right wrist compared to left wrist.  Patient states that she has not been monitoring blood pressure at home recently.  She does state for the past couple days, she has been having a mild headache but does resolve with use of Tylenol.  Declines vision changes or any other neurological symptoms.  Patient also endorses snoring with daytime fatigue.  Due to prior stroke, prior moderate stenosis and right ICA and cardiac risk factors, recommend sleep apnea testing.     ROS:   14 system review of  systems is positive for muscle cramps and all other systems negative  PMH:  Past Medical History:  Diagnosis Date  . GERD (gastroesophageal reflux disease)   . High cholesterol   . Hypertension   . Thyroid disease     Social History:  Social History   Socioeconomic History  . Marital status: Married    Spouse name: Not on file  . Number of children: Not on file  . Years  of education: Not on file  . Highest education level: Not on file  Occupational History  . Not on file  Social Needs  . Financial resource strain: Not on file  . Food insecurity:    Worry: Not on file    Inability: Not on file  . Transportation needs:    Medical: Not on file    Non-medical: Not on file  Tobacco Use  . Smoking status: Never Smoker  . Smokeless tobacco: Never Used  Substance and Sexual Activity  . Alcohol use: No  . Drug use: No  . Sexual activity: Not on file  Lifestyle  . Physical activity:    Days per week: Not on file    Minutes per session: Not on file  . Stress: Not on file  Relationships  . Social connections:    Talks on phone: Not on file    Gets together: Not on file    Attends religious service: Not on file    Active member of club or organization: Not on file    Attends meetings of clubs or organizations: Not on file    Relationship status: Not on file  . Intimate partner violence:    Fear of current or ex partner: Not on file    Emotionally abused: Not on file    Physically abused: Not on file    Forced sexual activity: Not on file  Other Topics Concern  . Not on file  Social History Narrative  . Not on file    Medications:   Current Outpatient Medications on File Prior to Visit  Medication Sig Dispense Refill  . amLODipine-benazepril (LOTREL) 5-10 MG per capsule Take 1 capsule by mouth daily.    Marland Kitchen atenolol (TENORMIN) 25 MG tablet Take 25 mg by mouth daily.    Marland Kitchen azithromycin (ZITHROMAX Z-PAK) 250 MG tablet 2 po day one, then 1 daily x 4 days 6 tablet 0  . calcium-vitamin D (OSCAL WITH D) 500-200 MG-UNIT per tablet Take 1 tablet by mouth daily.    . clopidogrel (PLAVIX) 75 MG tablet Take 1 tablet (75 mg total) by mouth daily. 30 tablet 3  . dicyclomine (BENTYL) 20 MG tablet Take 20 mg by mouth every 6 (six) hours as needed. Abdominal cramps  0  . levothyroxine (SYNTHROID, LEVOTHROID) 50 MCG tablet Take 50 mcg by mouth at bedtime.  1  .  meclizine (ANTIVERT) 25 MG tablet Take 1 tablet (25 mg total) by mouth every 6 (six) hours as needed for dizziness. 28 tablet 0  . methylPREDNISolone (MEDROL DOSEPAK) 4 MG TBPK tablet Take as directed 21 tablet 0  . omeprazole (PRILOSEC) 20 MG capsule Take 20 mg by mouth daily.    . simvastatin (ZOCOR) 80 MG tablet Take 1 tablet (80 mg total) by mouth daily. 90 tablet 1  . SIMVASTATIN PO Take by mouth.     No current facility-administered medications on file prior to visit.     Allergies:  No Known Allergies  Physical Exam General: Obese middle-aged African-American lady, seated, in  no evident distress Head: head normocephalic and atraumatic.  Neck: supple with no carotid or supraclavicular bruits Cardiovascular: regular rate and rhythm, no murmurs; weak radial pulse right wrist compared to left wrist Musculoskeletal: no deformity Skin:  no rash/petichiae Vascular:  Normal pulses all extremities Vitals:   08/26/18 1100 08/26/18 1101  BP: (!) 182/108 (!) 162/82  Pulse:    (182/108 - Left arm; 162/82 - right arm)    Neurologic Exam Mental Status: Awake and fully alert. Oriented to place and time. Recent and remote memory intact. Attention span, concentration and fund of knowledge appropriate. Mood and affect appropriate.  Cranial Nerves: Fundoscopic exam reveals sharp disc margins. Pupils equal, briskly reactive to light. Extraocular movements full without nystagmus. Visual fields full to confrontation. Hearing intact. Facial sensation intact. Face, tongue, palate moves normally and symmetrically.  Motor: Normal bulk and tone. Normal strength in all tested extremity muscles. Sensory.: intact to touch ,pinprick .position and vibratory sensation.  Coordination: Rapid alternating movements normal in all extremities. Finger-to-nose and heel-to-shin performed accurately bilaterally. Gait and Station: Arises from chair without difficulty. Stance is normal. Gait demonstrates normal stride  length and balance . Able to heel, toe and tandem walk without difficulty.  Reflexes: 1+ and symmetric. Toes downgoing.     ASSESSMENT: 57 year old African-American lady with episode of posterior circulation TIA in January 2019. Vascular risk factors of obesity, hypertension, hyperlipidemia.but now showing Lipitor intolerance.  Patient returns today for follow-up visit and overall states she is doing well but did find 20 point difference in upper extremities in SBP along with weak radial pulse and right arm compared to left arm.   PLAN: -Continue clopidogrel 75 mg daily  and simvastatin for secondary stroke prevention -F/u with PCP regarding your HLD and HTN management -Order placed for bilateral carotid ultrasound to rule out possible increased stenosis in right ICA -Referral placed for vascular surgery due to 20 point difference between upper extremity blood pressures -Highly recommended to monitor blood pressures at home and and in both upper extremities and record.  Advised patient that if headache worsens, has visual changes, feelings of lightheadedness or neurological concerns, to call 911 immediately -Referral placed for sleep apnea testing at [facility] -Maintain strict control of hypertension with blood pressure goal below 130/90, diabetes with hemoglobin A1c goal below 6.5% and cholesterol with LDL cholesterol (bad cholesterol) goal below 70 mg/dL. I also advised the patient to eat a healthy diet with plenty of whole grains, cereals, fruits and vegetables, exercise regularly and maintain ideal body weight.  Follow up in 3 months or call earlier if needed  Greater than 50% of time during this 25 minute visit was spent on counseling,explanation of diagnosis, planning of further management, discussion with patient and family and coordination of care  George Hugh, [facility]  [facility] [address] [place], [place] [address]  Phone  [phone] Fax [phone] Note: This document was prepared with digital dictation and possible smart phrase technology. Any transcriptional errors that result from this process are unintentional.

## 2018-08-26 NOTE — Telephone Encounter (Signed)
Patient is scheduled at [facility] 08/27/2018 . Patient will have her carotid doppler at 1:45 and after that she will follow with Dr. Lemar LivingsBrandon Cain at 3:00 pm. I have talked to patient with all details and she is ware and she able to make apt. Shanda BumpsJessica she thanks you.

## 2018-08-26 NOTE — Patient Instructions (Addendum)
Continue clopidogrel 75 mg daily  and Zocor 80mg   for secondary stroke prevention  Continue to follow up with PCP regarding cholesterol and blood pressure management   You will be called to schedule sleep study  You will be called to schedule carotid ultrasound and appointment with vascular surgery  Continue to monitor blood pressure at home  Maintain strict control of hypertension with blood pressure goal below 130/90, diabetes with hemoglobin A1c goal below 6.5% and cholesterol with LDL cholesterol (bad cholesterol) goal below 70 mg/dL. I also advised the patient to eat a healthy diet with plenty of whole grains, cereals, fruits and vegetables, exercise regularly and maintain ideal body weight.  Followup in the future with me in 3 months or call earlier if needed       Thank you for coming to see us at [facility]. I hope we have been able to provide you high quality care today.  You may receive a patient satisfaction survey over the next few weeks. We would appreciate your feedback and comments so that we may continue to improve ourselves and the health of our patients.

## 2018-08-26 NOTE — Telephone Encounter (Signed)
Thank you very much for getting this scheduled so quickly!

## 2018-08-27 ENCOUNTER — Ambulatory Visit (HOSPITAL_COMMUNITY)
Admission: RE | Admit: 2018-08-27 | Discharge: 2018-08-27 | Disposition: A | Discharge status: Home / Self Care | Payer: Managed Care, Other (non HMO) | Source: Ambulatory Visit | Attending: Vascular Surgery | Admitting: Vascular Surgery

## 2018-08-27 ENCOUNTER — Other Ambulatory Visit: Payer: Self-pay

## 2018-08-27 ENCOUNTER — Ambulatory Visit (INDEPENDENT_AMBULATORY_CARE_PROVIDER_SITE_OTHER): Payer: Managed Care, Other (non HMO) | Admitting: Vascular Surgery

## 2018-08-27 ENCOUNTER — Encounter: Payer: Self-pay | Admitting: Vascular Surgery

## 2018-08-27 VITALS — BP 160/91 | HR 72 | Temp 97.6°F | Resp 16 | Ht 65.0 in | Wt 280.0 lb

## 2018-08-27 DIAGNOSIS — I10 Essential (primary) hypertension: Secondary | ICD-10-CM | POA: Insufficient documentation

## 2018-08-27 DIAGNOSIS — G459 Transient cerebral ischemic attack, unspecified: Secondary | ICD-10-CM | POA: Diagnosis not present

## 2018-08-27 DIAGNOSIS — I771 Stricture of artery: Secondary | ICD-10-CM

## 2018-08-27 NOTE — Progress Notes (Signed)
Patient ID: Kimberly Watts, female   DOB: 22-May-1961, 57 y.o.   MRN: [id number]  Reason for Consult: New Patient (Initial Visit) (history if TIA)   Referred by George Hugh, NP  Subjective:     HPI:  Kimberly Watts is a 57 y.o. female presents for evaluation of bilateral carotid arteries and possible subclavian disease.  She does have a history of a TIA now on aspirin and statin daily.  She is a former smoker quit many years ago.  She was at the neurologist office evaluated with right arm blood pressure 20 points less than the left.  She does have a history of stroke concern for vertebral artery occlusive disease.  For that reason she is sent to vascular for evaluation.  She has no upper extremity symptoms uses her right arm which is her dominant arm.  She is prediabetic she also has high cholesterol and hypertension as risk factors.  Past Medical History:  Diagnosis Date  . GERD (gastroesophageal reflux disease)   . High cholesterol   . Hypertension   . Thyroid disease    Family History  Problem Relation Age of Onset  . Stroke Mother   . Diabetes Mother   . Hypertension Mother   . Diabetes Father   . Hypertension Father   . Heart attack Father    Past Surgical History:  Procedure Laterality Date  . BREAST REDUCTION SURGERY    . REDUCTION MAMMAPLASTY Bilateral    in the 80's    Short Social History:  Social History   Tobacco Use  . Smoking status: Never Smoker  . Smokeless tobacco: Never Used  Substance Use Topics  . Alcohol use: No    No Known Allergies  Current Outpatient Medications  Medication Sig Dispense Refill  . amLODipine-benazepril (LOTREL) 5-10 MG per capsule Take 1 capsule by mouth daily.    Marland Kitchen atenolol (TENORMIN) 25 MG tablet Take 25 mg by mouth daily.    . calcium-vitamin D (OSCAL WITH D) 500-200 MG-UNIT per tablet Take 1 tablet by mouth daily.    . clopidogrel (PLAVIX) 75 MG tablet Take 1 tablet (75 mg total) by mouth daily. 30 tablet 3    . dicyclomine (BENTYL) 20 MG tablet Take 20 mg by mouth every 6 (six) hours as needed. Abdominal cramps  0  . levothyroxine (SYNTHROID, LEVOTHROID) 50 MCG tablet Take 50 mcg by mouth at bedtime.  1  . meclizine (ANTIVERT) 25 MG tablet Take 1 tablet (25 mg total) by mouth every 6 (six) hours as needed for dizziness. 28 tablet 0  . omeprazole (PRILOSEC) 20 MG capsule Take 20 mg by mouth daily.    . simvastatin (ZOCOR) 80 MG tablet Take 1 tablet (80 mg total) by mouth daily. 90 tablet 1  . azithromycin (ZITHROMAX Z-PAK) 250 MG tablet 2 po day one, then 1 daily x 4 days (Patient not taking: Reported on 08/27/2018) 6 tablet 0  . methylPREDNISolone (MEDROL DOSEPAK) 4 MG TBPK tablet Take as directed (Patient not taking: Reported on 08/27/2018) 21 tablet 0  . SIMVASTATIN PO Take by mouth.     No current facility-administered medications for this visit.     Review of Systems  Constitutional:  Constitutional negative. HENT: HENT negative.  Eyes: Eyes negative.  Respiratory: Respiratory negative.  Cardiovascular: Cardiovascular negative.  GI: Gastrointestinal negative.  Musculoskeletal: Musculoskeletal negative.  Skin: Skin negative.  Neurological: Positive for dizziness.  Hematologic: Hematologic/lymphatic negative.  Psychiatric: Psychiatric negative.  Objective:  Objective   Vitals:   08/27/18 1443 08/27/18 1447  BP: (!) 156/92 (!) 160/91  Pulse: 72 72  Resp: 16   Temp: 97.6 F (36.4 C)   TempSrc: Oral   SpO2: 97%   Weight: 280 lb (127 kg)   Height: 5\' 5"  (1.651 m)    Body mass index is 46.59 kg/m.  Physical Exam  Constitutional: She is oriented to person, place, and time. She appears well-developed.  HENT:  Head: Normocephalic.  Eyes: Pupils are equal, round, and reactive to light.  Neck: Normal range of motion. Neck supple.  Cardiovascular: Normal rate.  Pulses:      Carotid pulses are 2+ on the right side, and 2+ on the left side.      Radial pulses are 2+ on the  right side, and 2+ on the left side.       Dorsalis pedis pulses are 2+ on the right side, and 2+ on the left side.  No carotid or subclavian bruits detected  Pulmonary/Chest: Effort normal and breath sounds normal.  Abdominal: Soft. She exhibits no mass.  Musculoskeletal: Normal range of motion. She exhibits no edema.  Neurological: She is alert and oriented to person, place, and time.  Skin: Skin is dry.  Psychiatric: She has a normal mood and affect. Her behavior is normal. Judgment and thought content normal.    Data: I independently interpreted her bilateral carotid artery duplexes which demonstrate no evidence of stenosis in her extracranial carotid arteries.  Vertebral arteries bilaterally have antegrade flow without stenosis.     Assessment/Plan:     57 year old female presents for evaluation of carotid artery disease having history of TIA now on Plavix and statin drug daily.  She also has a 20 point lower blood pressure in the right relative to the left.  Even that she is a symptomatically palpable radial pulses and antegrade vertebral flow bilaterally I would not recommend any intervention.  Certainly if she has further symptoms she would need further evaluation.  She can follow-up on an as-needed basis peer     Maeola HarmanBrandon Christopher Telford Archambeau MD [facility]

## 2018-08-27 NOTE — Progress Notes (Signed)
Vitals:   08/27/18 1443  BP: (!) 156/92  Pulse: 72  Resp: 16  Temp: 97.6 F (36.4 C)  TempSrc: Oral  SpO2: 97%  Weight: 280 lb (127 kg)  Height: 5\' 5"  (1.651 m)

## 2018-08-29 NOTE — Progress Notes (Signed)
I agree with the above plan 

## 2018-10-17 ENCOUNTER — Encounter (HOSPITAL_COMMUNITY): Payer: Self-pay | Admitting: Family Medicine

## 2018-10-17 ENCOUNTER — Ambulatory Visit (HOSPITAL_COMMUNITY)
Admission: EM | Admit: 2018-10-17 | Discharge: 2018-10-17 | Disposition: A | Discharge status: Home / Self Care | Payer: Managed Care, Other (non HMO) | Attending: Family Medicine | Admitting: Family Medicine

## 2018-10-17 DIAGNOSIS — R002 Palpitations: Secondary | ICD-10-CM | POA: Insufficient documentation

## 2018-10-17 DIAGNOSIS — I10 Essential (primary) hypertension: Secondary | ICD-10-CM | POA: Insufficient documentation

## 2018-10-17 DIAGNOSIS — E7800 Pure hypercholesterolemia, unspecified: Secondary | ICD-10-CM | POA: Insufficient documentation

## 2018-10-17 DIAGNOSIS — I2510 Atherosclerotic heart disease of native coronary artery without angina pectoris: Secondary | ICD-10-CM | POA: Insufficient documentation

## 2018-10-17 DIAGNOSIS — Z823 Family history of stroke: Secondary | ICD-10-CM | POA: Insufficient documentation

## 2018-10-17 DIAGNOSIS — Z8249 Family history of ischemic heart disease and other diseases of the circulatory system: Secondary | ICD-10-CM | POA: Insufficient documentation

## 2018-10-17 DIAGNOSIS — Z79890 Hormone replacement therapy (postmenopausal): Secondary | ICD-10-CM | POA: Insufficient documentation

## 2018-10-17 DIAGNOSIS — Z79899 Other long term (current) drug therapy: Secondary | ICD-10-CM | POA: Insufficient documentation

## 2018-10-17 DIAGNOSIS — Z8673 Personal history of transient ischemic attack (TIA), and cerebral infarction without residual deficits: Secondary | ICD-10-CM | POA: Insufficient documentation

## 2018-10-17 DIAGNOSIS — Z87891 Personal history of nicotine dependence: Secondary | ICD-10-CM | POA: Insufficient documentation

## 2018-10-17 DIAGNOSIS — K219 Gastro-esophageal reflux disease without esophagitis: Secondary | ICD-10-CM | POA: Insufficient documentation

## 2018-10-17 DIAGNOSIS — E039 Hypothyroidism, unspecified: Secondary | ICD-10-CM | POA: Insufficient documentation

## 2018-10-17 LAB — POCT I-STAT, CHEM 8
BUN: 8 mg/dL (ref 6–20)
CALCIUM ION: 1.17 mmol/L (ref 1.15–1.40)
CHLORIDE: 106 mmol/L (ref 98–111)
CREATININE: 0.9 mg/dL (ref 0.44–1.00)
Glucose, Bld: 92 mg/dL (ref 70–99)
HCT: 41 % (ref 36.0–46.0)
Hemoglobin: 13.9 g/dL (ref 12.0–15.0)
Potassium: 3.6 mmol/L (ref 3.5–5.1)
Sodium: 144 mmol/L (ref 135–145)
TCO2: 27 mmol/L (ref 22–32)

## 2018-10-17 LAB — TSH: TSH: 2.299 u[IU]/mL (ref 0.350–4.500)

## 2018-10-17 LAB — T4, FREE: Free T4: 0.8 ng/dL — ABNORMAL LOW (ref 0.82–1.77)

## 2018-10-17 NOTE — Discharge Instructions (Addendum)
Your lab work tonight came back normal.  Your potassium in particular was 3.6 which is normal.  EKG is normal as well.  The thyroid test is pending.  I suggest you follow-up with Dr. Manus Gunning to consider further testing, particularly if symptoms persist.

## 2018-10-17 NOTE — ED Provider Notes (Signed)
[facility]    CSN: [id number] Arrival date & time: 10/17/18  1714     History   Chief Complaint Chief Complaint  Patient presents with  . Palpitations    HPI Kimberly Watts is a 57 y.o. female.   C/O intermittent "chest fluttering" and "like my heart is beating fast" over past week.  C/O fatigue and belching a lot.  Denies any c/o's at this present time.  Patient has a history of TIA and cardiovascular disease.  Also included on her problem list is essential hypertension and hypothyroidism.  Patient says her symptoms come and go, sometimes lasting as long as an hour.  She never has any chest pain but she has had some burning in her back.  She is taken antacids and they have not helped.  Patient said no shortness of breath, abdominal pain, leg pain, or leg swelling.     Past Medical History:  Diagnosis Date  . GERD (gastroesophageal reflux disease)   . High cholesterol   . Hypertension   . Thyroid disease     Patient Active Problem List   Diagnosis Date Noted  . TIA (transient ischemic attack) 12/22/2017  . Essential hypertension 12/22/2017  . Hypothyroidism 12/22/2017  . Weakness 12/21/2017  . Acute cystitis without hematuria   . ACUTE PANCREATITIS 08/23/2009    Past Surgical History:  Procedure Laterality Date  . BREAST REDUCTION SURGERY    . REDUCTION MAMMAPLASTY Bilateral    in the 80's  . TUBAL LIGATION      OB History   None      Home Medications    Prior to Admission medications   Medication Sig Start Date End Date Taking? Authorizing Provider  amLODipine-benazepril (LOTREL) 5-10 MG per capsule Take 1 capsule by mouth daily.   Yes [provider]  atenolol (TENORMIN) 25 MG tablet Take 25 mg by mouth daily.   Yes [provider]  calcium-vitamin D (OSCAL WITH D) 500-200 MG-UNIT per tablet Take 1 tablet by mouth daily.   Yes [provider]  clopidogrel (PLAVIX) 75 MG tablet Take 1 tablet (75 mg total) by  mouth daily. 12/22/17  Yes Rai, Ripudeep K, MD  levothyroxine (SYNTHROID, LEVOTHROID) 50 MCG tablet Take 50 mcg by mouth at bedtime. 10/11/17  Yes [provider]  omeprazole (PRILOSEC) 20 MG capsule Take 20 mg by mouth daily.   Yes [provider]  simvastatin (ZOCOR) 80 MG tablet Take 1 tablet (80 mg total) by mouth daily. 04/28/18  Yes Micki Riley, MD    Family History Family History  Problem Relation Age of Onset  . Stroke Mother   . Diabetes Mother   . Hypertension Mother   . Diabetes Father   . Hypertension Father   . Heart attack Father     Social History Social History   Tobacco Use  . Smoking status: Former Games developer  . Smokeless tobacco: Never Used  Substance Use Topics  . Alcohol use: No  . Drug use: No     Allergies   Patient has no known allergies.   Review of Systems Review of Systems   Physical Exam Triage Vital Signs ED Triage Vitals  Enc Vitals Group     BP      Pulse      Resp      Temp      Temp src      SpO2      Weight      Height  Head Circumference      Peak Flow      Pain Score      Pain Loc      Pain Edu?      Excl. in GC?    No data found.  Updated Vital Signs BP (!) 185/72 (BP Location: Left Arm)   Pulse 70 Comment: regular  Temp 98.3 F (36.8 C) (Oral)   Resp 18   SpO2 95%    Physical Exam  Constitutional: She is oriented to person, place, and time. She appears well-developed and well-nourished.  Morbidly obese  HENT:  Right Ear: External ear normal.  Left Ear: External ear normal.  Mouth/Throat: Oropharynx is clear and moist.  Eyes: Conjunctivae are normal.  Neck: Normal range of motion. Neck supple.  Cardiovascular: Normal rate, regular rhythm and normal heart sounds.  Pulmonary/Chest: Effort normal and breath sounds normal.  Abdominal: Soft.  Musculoskeletal: Normal range of motion.  Neurological: She is alert and oriented to person, place, and time.  Skin: Skin is warm and dry.    Psychiatric: She has a normal mood and affect.  Nursing note and vitals reviewed.    UC Treatments / Results  Labs (all labs ordered are listed, but only abnormal results are displayed) Labs Reviewed  T4, FREE  TSH  POCT I-STAT, CHEM 8    EKG Normal sinus rhythm  Radiology No results found.  Procedures Procedures (including critical care time)  Medications Ordered in UC Medications - No data to display  Initial Impression / Assessment and Plan / UC Course  I have reviewed the triage vital signs and the nursing notes.  Pertinent labs & imaging results that were available during my care of the patient were reviewed by me and considered in my medical decision making (see chart for details).    Final Clinical Impressions(s) / UC Diagnoses   Final diagnoses:  Palpitations     Discharge Instructions     Your lab work tonight came back normal.  Your potassium in particular was 3.6 which is normal.  EKG is normal as well.  The thyroid test is pending.  I suggest you follow-up with Dr. Manus Gunning to consider further testing, particularly if symptoms persist.    ED Prescriptions    None     Controlled Substance Prescriptions [place] Controlled Substance Registry consulted? Not Applicable   Elvina Sidle, MD 10/17/18 1825

## 2018-10-17 NOTE — ED Triage Notes (Signed)
C/O intermittent "chest fluttering" and "like my heart is beating fast" over past week.  C/O fatigue and belching a lot.  Denies any c/o's at this present time.

## 2018-10-18 ENCOUNTER — Telehealth (HOSPITAL_COMMUNITY): Payer: Self-pay

## 2018-10-18 NOTE — Telephone Encounter (Signed)
Per Dr. Delton See patient should follow up with PCP. Attempted to reach patient. No answer at this time.

## 2018-11-29 ENCOUNTER — Other Ambulatory Visit: Payer: Self-pay | Admitting: Neurology

## 2018-12-02 ENCOUNTER — Ambulatory Visit: Payer: Managed Care, Other (non HMO) | Admitting: Adult Health

## 2019-03-08 ENCOUNTER — Other Ambulatory Visit: Payer: Self-pay | Admitting: Neurology

## 2019-03-12 ENCOUNTER — Other Ambulatory Visit: Payer: Self-pay | Admitting: Neurology

## 2019-05-13 ENCOUNTER — Other Ambulatory Visit: Payer: Self-pay

## 2019-05-16 ENCOUNTER — Other Ambulatory Visit: Payer: Self-pay | Admitting: Family Medicine

## 2019-05-16 DIAGNOSIS — N644 Mastodynia: Secondary | ICD-10-CM

## 2019-05-17 ENCOUNTER — Other Ambulatory Visit: Payer: Self-pay | Admitting: Family Medicine

## 2019-05-17 DIAGNOSIS — N644 Mastodynia: Secondary | ICD-10-CM

## 2019-06-13 ENCOUNTER — Other Ambulatory Visit: Payer: Self-pay | Admitting: Family Medicine

## 2019-06-13 ENCOUNTER — Other Ambulatory Visit: Payer: Self-pay

## 2019-06-13 ENCOUNTER — Ambulatory Visit
Admission: RE | Admit: 2019-06-13 | Discharge: 2019-06-13 | Disposition: A | Discharge status: Home / Self Care | Payer: Commercial Managed Care - PPO | Source: Ambulatory Visit | Attending: Family Medicine | Admitting: Family Medicine

## 2019-06-13 ENCOUNTER — Ambulatory Visit: Payer: Self-pay

## 2019-06-13 DIAGNOSIS — Z1231 Encounter for screening mammogram for malignant neoplasm of breast: Secondary | ICD-10-CM

## 2019-06-13 DIAGNOSIS — N644 Mastodynia: Secondary | ICD-10-CM

## 2019-06-14 ENCOUNTER — Ambulatory Visit: Payer: Commercial Managed Care - PPO

## 2019-06-16 ENCOUNTER — Other Ambulatory Visit: Payer: Self-pay | Admitting: Family Medicine

## 2019-06-25 ENCOUNTER — Other Ambulatory Visit: Payer: Self-pay | Admitting: *Deleted

## 2019-06-25 DIAGNOSIS — Z20822 Contact with and (suspected) exposure to covid-19: Secondary | ICD-10-CM

## 2019-07-01 LAB — NOVEL CORONAVIRUS, NAA: SARS-CoV-2, NAA: NOT DETECTED

## 2019-12-29 ENCOUNTER — Emergency Department (HOSPITAL_BASED_OUTPATIENT_CLINIC_OR_DEPARTMENT_OTHER)
Admission: EM | Admit: 2019-12-29 | Discharge: 2019-12-29 | Disposition: A | Discharge status: Home / Self Care | Payer: Commercial Managed Care - PPO | Attending: Emergency Medicine | Admitting: Emergency Medicine

## 2019-12-29 ENCOUNTER — Emergency Department (HOSPITAL_BASED_OUTPATIENT_CLINIC_OR_DEPARTMENT_OTHER): Payer: Commercial Managed Care - PPO

## 2019-12-29 ENCOUNTER — Other Ambulatory Visit: Payer: Self-pay

## 2019-12-29 ENCOUNTER — Encounter (HOSPITAL_BASED_OUTPATIENT_CLINIC_OR_DEPARTMENT_OTHER): Payer: Self-pay

## 2019-12-29 DIAGNOSIS — I10 Essential (primary) hypertension: Secondary | ICD-10-CM | POA: Diagnosis not present

## 2019-12-29 DIAGNOSIS — Z87891 Personal history of nicotine dependence: Secondary | ICD-10-CM | POA: Insufficient documentation

## 2019-12-29 DIAGNOSIS — R42 Dizziness and giddiness: Secondary | ICD-10-CM | POA: Diagnosis not present

## 2019-12-29 DIAGNOSIS — E039 Hypothyroidism, unspecified: Secondary | ICD-10-CM | POA: Insufficient documentation

## 2019-12-29 LAB — CBC WITH DIFFERENTIAL/PLATELET
Abs Immature Granulocytes: 0.02 10*3/uL (ref 0.00–0.07)
Basophils Absolute: 0 10*3/uL (ref 0.0–0.1)
Basophils Relative: 1 %
Eosinophils Absolute: 0.1 10*3/uL (ref 0.0–0.5)
Eosinophils Relative: 1 %
HCT: 45.2 % (ref 36.0–46.0)
Hemoglobin: 14.5 g/dL (ref 12.0–15.0)
Immature Granulocytes: 0 %
Lymphocytes Relative: 27 %
Lymphs Abs: 2.1 10*3/uL (ref 0.7–4.0)
MCH: 31.8 pg (ref 26.0–34.0)
MCHC: 32.1 g/dL (ref 30.0–36.0)
MCV: 99.1 fL (ref 80.0–100.0)
Monocytes Absolute: 0.8 10*3/uL (ref 0.1–1.0)
Monocytes Relative: 10 %
Neutro Abs: 4.8 10*3/uL (ref 1.7–7.7)
Neutrophils Relative %: 61 %
Platelets: 269 10*3/uL (ref 150–400)
RBC: 4.56 MIL/uL (ref 3.87–5.11)
RDW: 12.6 % (ref 11.5–15.5)
WBC: 7.8 10*3/uL (ref 4.0–10.5)
nRBC: 0 % (ref 0.0–0.2)

## 2019-12-29 LAB — COMPREHENSIVE METABOLIC PANEL
ALT: 21 U/L (ref 0–44)
AST: 17 U/L (ref 15–41)
Albumin: 4.3 g/dL (ref 3.5–5.0)
Alkaline Phosphatase: 92 U/L (ref 38–126)
Anion gap: 11 (ref 5–15)
BUN: 10 mg/dL (ref 6–20)
CO2: 27 mmol/L (ref 22–32)
Calcium: 9.6 mg/dL (ref 8.9–10.3)
Chloride: 104 mmol/L (ref 98–111)
Creatinine, Ser: 0.85 mg/dL (ref 0.44–1.00)
GFR calc Af Amer: >60 mL/min (ref >60)
GFR calc non Af Amer: >60 mL/min (ref >60)
Glucose, Bld: 111 mg/dL — ABNORMAL HIGH (ref 70–99)
Potassium: 3.7 mmol/L (ref 3.5–5.1)
Sodium: 142 mmol/L (ref 135–145)
Total Bilirubin: 0.7 mg/dL (ref 0.3–1.2)
Total Protein: 8.2 g/dL — ABNORMAL HIGH (ref 6.5–8.1)

## 2019-12-29 LAB — TROPONIN I (HIGH SENSITIVITY): Troponin I (High Sensitivity): 3 ng/L (ref <18)

## 2019-12-29 MED ORDER — MECLIZINE HCL 12.5 MG PO TABS: 12.5000 mg | ORAL_TABLET | Freq: Three times a day (TID) | ORAL | 0 refills | Status: AC | PRN

## 2019-12-29 NOTE — ED Triage Notes (Signed)
Pt c/o dizziness x 3 days-denies HA and CP-sent from [facility]-steady gait

## 2019-12-29 NOTE — ED Provider Notes (Signed)
Emergency Department Provider Note   I have reviewed the triage vital signs and the nursing notes.   HISTORY  Chief Complaint Dizziness   HPI Kimberly Watts is a 59 y.o. female with PMH of GERD, HTN, and HLD presents to the emergency department for evaluation  of intermittent lightheadedness and elevated blood pressure over the past week.  Patient had one episode of vertigo which resolved quickly and states that most of her symptoms have been lightheaded in nature.  She denies chest pain, palpitations, shortness of breath.  She is not having fever or chills.  No flulike symptoms.  She is not having a headache but states she feels some head fullness.  Denies abdominal pain, vomiting, diarrhea.  She initially went to urgent care and states she was referred here with elevated blood pressure and the above symptoms.  She denies history of stroke. She is compliant with her home medications.   Past Medical History:  Diagnosis Date  . GERD (gastroesophageal reflux disease)   . High cholesterol   . Hypertension   . Thyroid disease     Patient Active Problem List   Diagnosis Date Noted  . TIA (transient ischemic attack) 12/22/2017  . Essential hypertension 12/22/2017  . Hypothyroidism 12/22/2017  . Weakness 12/21/2017  . Acute cystitis without hematuria   . ACUTE PANCREATITIS 08/23/2009    Past Surgical History:  Procedure Laterality Date  . BREAST REDUCTION SURGERY    . REDUCTION MAMMAPLASTY Bilateral    in the 80's  . TUBAL LIGATION      Allergies Adalat [nifedipine]  Family History  Problem Relation Age of Onset  . Stroke Mother   . Diabetes Mother   . Hypertension Mother   . Diabetes Father   . Hypertension Father   . Heart attack Father     Social History Social History   Tobacco Use  . Smoking status: Former Games developer  . Smokeless tobacco: Never Used  Substance Use Topics  . Alcohol use: No  . Drug use: No    Review of Systems  Constitutional: No  fever/chills. Intermittent lightheadedness.  Eyes: Intermittent blurry vision (none currently) ENT: No sore throat. Single episode of vertigo 2 days prior.  Cardiovascular: Denies chest pain. Respiratory: Denies shortness of breath. Gastrointestinal: No abdominal pain. No nausea. One episode of vomiting yesterday. No diarrhea.  No constipation. Genitourinary: Negative for dysuria. Musculoskeletal: Negative for back pain. Skin: Negative for rash. Neurological: Negative for headaches, focal weakness or numbness.  10-point ROS otherwise negative.  ____________________________________________   PHYSICAL EXAM:  VITAL SIGNS: ED Triage Vitals  Enc Vitals Group     BP 12/29/19 2203 (!) 168/92     Pulse Rate 12/29/19 2203 70     Resp 12/29/19 2203 18     Temp 12/29/19 2203 99 F (37.2 C)     Temp Source 12/29/19 2203 Oral     SpO2 12/29/19 2203 100 %     Weight 12/29/19 2203 282 lb (127.9 kg)     Height 12/29/19 2203 5\' 5"  (1.651 m)   Constitutional: Alert and oriented. Well appearing and in no acute distress. Eyes: Conjunctivae are normal. PERRL. EOMI. Head: Atraumatic. Nose: No congestion/rhinnorhea. Mouth/Throat: Mucous membranes are moist.   Neck: No stridor.   Cardiovascular: Normal rate, regular rhythm. Good peripheral circulation. Grossly normal heart sounds.   Respiratory: Normal respiratory effort.  No retractions. Lungs CTAB. Gastrointestinal: Soft and nontender. No distention.  Musculoskeletal: No lower extremity tenderness nor edema. No gross  deformities of extremities. Neurologic:  Normal speech and language. No gross focal neurologic deficits are appreciated. No CN deficit 2-12. 5/5 strength in the upper and lower extremities. Normal finger to nose and heel to shin.  Skin:  Skin is warm, dry and intact. No rash noted.  ____________________________________________   LABS (all labs ordered are listed, but only abnormal results are displayed)  Labs Reviewed    COMPREHENSIVE METABOLIC PANEL - Abnormal; Notable for the following components:      Result Value   Glucose, Bld 111 (*)    Total Protein 8.2 (*)    All other components within normal limits  CBC WITH DIFFERENTIAL/PLATELET  TROPONIN I (HIGH SENSITIVITY)  TROPONIN I (HIGH SENSITIVITY)   ____________________________________________  EKG  Rate: 63 PR: 197 QTc: 437  Sinus rhythm. Narrow QRS. Nonspecific ST changes. No STEMI  ____________________________________________  RADIOLOGY  CT head negative.  CXR reviewed. No acute findings.  ____________________________________________   PROCEDURES  Procedure(s) performed:   Procedures  None ____________________________________________   INITIAL IMPRESSION / ASSESSMENT AND PLAN / ED COURSE  Pertinent labs & imaging results that were available during my care of the patient were reviewed by me and considered in my medical decision making (see chart for details).   Patient presents to the emergency department for evaluation of intermittent lightheadedness and elevated blood pressure.  Moderately elevated to normal blood pressures here in the emergency department.  CT imaging of the head with chronic changes but nothing acute.  Chest x-ray clear.  Doubt atypical ACS, PE, stroke.   Plan for discharge with close PCP follow up. Discussed ED return precautions.  ____________________________________________  FINAL CLINICAL IMPRESSION(S) / ED DIAGNOSES  Final diagnoses:  Lightheadedness  Essential hypertension     NEW OUTPATIENT MEDICATIONS STARTED DURING THIS VISIT:  Discharge Medication List as of 12/29/2019 11:28 PM    START taking these medications   Details  meclizine (ANTIVERT) 12.5 MG tablet Take 1 tablet (12.5 mg total) by mouth 3 (three) times daily as needed for dizziness., Starting Thu 12/29/2019, Print        Note:  This document was prepared using Dragon voice recognition software and may include unintentional  dictation errors.  Nanda Quinton, MD, [facility] Emergency Medicine    Greely Atiyeh, Wonda Olds, MD 01/01/20 1944

## 2019-12-29 NOTE — Discharge Instructions (Signed)
You were seen in the emergency department today with intermittent lightheadedness and elevated blood pressure.  Your work-up today showed no clear cause for your symptoms.  I am refilling your prescription for meclizine in case your vertigo returns.  Please call your primary care doctor to schedule a follow-up appointment for blood pressure recheck and review of symptoms.  If you develop any new or suddenly worsening symptoms you should return to the emergency department immediately especially for chest pain, shortness of breath, sudden severe headache.

## 2020-03-19 ENCOUNTER — Ambulatory Visit: Payer: Commercial Managed Care - PPO | Admitting: Neurology

## 2020-03-19 ENCOUNTER — Encounter: Payer: Self-pay | Admitting: Neurology

## 2020-03-19 ENCOUNTER — Other Ambulatory Visit: Payer: Self-pay

## 2020-03-19 VITALS — BP 150/86 | HR 65 | Temp 97.5°F | Ht 65.0 in | Wt 280.0 lb

## 2020-03-19 DIAGNOSIS — G44209 Tension-type headache, unspecified, not intractable: Secondary | ICD-10-CM | POA: Diagnosis not present

## 2020-03-19 DIAGNOSIS — G44221 Chronic tension-type headache, intractable: Secondary | ICD-10-CM

## 2020-03-19 DIAGNOSIS — M542 Cervicalgia: Secondary | ICD-10-CM | POA: Diagnosis not present

## 2020-03-19 MED ORDER — TOPIRAMATE 50 MG PO TABS: 50.0000 mg | ORAL_TABLET | Freq: Two times a day (BID) | ORAL | 2 refills | Status: DC

## 2020-03-19 NOTE — Patient Instructions (Signed)
I had a long discussion with the patient regarding her neck pain and headaches for the last 3 months which sound like muscle tension headaches and neck pain but these are refractory hence recommend further evaluation by checking MRI scan of the brain and cervical spine.  I recommend she do regular neck stretching exercises as well as use methocarbamol as needed.  Trial of Topamax 50 mg daily for a week to be increased to twice daily as tolerated.  She will continue Plavix for stroke prevention and maintain aggressive risk factor modification with strict control of hypertension with blood pressure goal below 130/90, lipids with LDL cholesterol goal below 70 mg percent and diabetes with hemoglobin A1c goal below 6.5%.  She will return for follow-up in the future in 3 months with Shanda Bumps my nurse practitioner on call earlier if necessary.  Neck Exercises Ask your health care provider which exercises are safe for you. Do exercises exactly as told by your health care provider and adjust them as directed. It is normal to feel mild stretching, pulling, tightness, or discomfort as you do these exercises. Stop right away if you feel sudden pain or your pain gets worse. Do not begin these exercises until told by your health care provider. Neck exercises can be important for many reasons. They can improve strength and maintain flexibility in your neck, which will help your upper back and prevent neck pain. Stretching exercises Rotation neck stretching  1. Sit in a chair or stand up. 2. Place your feet flat on the floor, shoulder width apart. 3. Slowly turn your head (rotate) to the right until a slight stretch is felt. Turn it all the way to the right so you can look over your right shoulder. Do not tilt or tip your head. 4. Hold this position for 10-30 seconds. 5. Slowly turn your head (rotate) to the left until a slight stretch is felt. Turn it all the way to the left so you can look over your left shoulder. Do  not tilt or tip your head. 6. Hold this position for 10-30 seconds. Repeat __________ times. Complete this exercise __________ times a day. Neck retraction 1. Sit in a sturdy chair or stand up. 2. Look straight ahead. Do not bend your neck. 3. Use your fingers to push your chin backward (retraction). Do not bend your neck for this movement. Continue to face straight ahead. If you are doing the exercise properly, you will feel a slight sensation in your throat and a stretch at the back of your neck. 4. Hold the stretch for 1-2 seconds. Repeat __________ times. Complete this exercise __________ times a day. Strengthening exercises Neck press 1. Lie on your back on a firm bed or on the floor with a pillow under your head. 2. Use your neck muscles to push your head down on the pillow and straighten your spine. 3. Hold the position as well as you can. Keep your head facing up (in a neutral position) and your chin tucked. 4. Slowly count to 5 while holding this position. Repeat __________ times. Complete this exercise __________ times a day. Isometrics These are exercises in which you strengthen the muscles in your neck while keeping your neck still (isometrics). 1. Sit in a supportive chair and place your hand on your forehead. 2. Keep your head and face facing straight ahead. Do not flex or extend your neck while doing isometrics. 3. Push forward with your head and neck while pushing back with your hand. Hold  for 10 seconds. 4. Do the sequence again, this time putting your hand against the back of your head. Use your head and neck to push backward against the hand pressure. 5. Finally, do the same exercise on either side of your head, pushing sideways against the pressure of your hand. Repeat __________ times. Complete this exercise __________ times a day. Prone head lifts 1. Lie face-down (prone position), resting on your elbows so that your chest and upper back are raised. 2. Start with your  head facing downward, near your chest. Position your chin either on or near your chest. 3. Slowly lift your head upward. Lift until you are looking straight ahead. Then continue lifting your head as far back as you can comfortably stretch. 4. Hold your head up for 5 seconds. Then slowly lower it to your starting position. Repeat __________ times. Complete this exercise __________ times a day. Supine head lifts 1. Lie on your back (supine position), bending your knees to point to the ceiling and keeping your feet flat on the floor. 2. Lift your head slowly off the floor, raising your chin toward your chest. 3. Hold for 5 seconds. Repeat __________ times. Complete this exercise __________ times a day. Scapular retraction 1. Stand with your arms at your sides. Look straight ahead. 2. Slowly pull both shoulders (scapulae) backward and downward (retraction) until you feel a stretch between your shoulder blades in your upper back. 3. Hold for 10-30 seconds. 4. Relax and repeat. Repeat __________ times. Complete this exercise __________ times a day. Contact a health care provider if:  Your neck pain or discomfort gets much worse when you do an exercise.  Your neck pain or discomfort does not improve within 2 hours after you exercise. If you have any of these problems, stop exercising right away. Do not do the exercises again unless your health care provider says that you can. Get help right away if:  You develop sudden, severe neck pain. If this happens, stop exercising right away. Do not do the exercises again unless your health care provider says that you can. This information is not intended to replace advice given to you by your health care provider. Make sure you discuss any questions you have with your health care provider. Document Revised: 09/29/2018 Document Reviewed: 09/29/2018 Elsevier Patient Education  2020 [employer]  Tension Headache, Adult A tension headache is a feeling of  pain, pressure, or aching in the head that is often felt over the front and sides of the head. The pain can be dull, or it can feel tight (constricting). There are two types of tension headache:  Episodic tension headache. This is when the headaches happen fewer than 15 days a month.  Chronic tension headache. This is when the headaches happen more than 15 days a month during a 32-month period. A tension headache can last from 30 minutes to several days. It is the most common kind of headache. Tension headaches are not normally associated with nausea or vomiting, and they do not get worse with physical activity. What are the causes? The exact cause of this condition is not known. Tension headaches are often triggered by stress, anxiety, or depression. Other triggers include:  Alcohol.  Too much caffeine or caffeine withdrawal.  Respiratory infections, such as colds, flu, or sinus infections.  Dental problems or teeth clenching.  Tiredness (fatigue).  Holding your head and neck in the same position for a long period of time, such as while using a computer.  Smoking.  Arthritis of the neck. What are the signs or symptoms? Symptoms of this condition include:  A feeling of pressure or tightness around the head.  Dull, aching head pain.  Pain over the front and sides of the head.  Tenderness in the muscles of the head, neck, and shoulders. How is this diagnosed? This condition may be diagnosed based on your symptoms, your medical history, and a physical exam. If your symptoms are severe or unusual, you may have imaging tests, such as a CT scan or an MRI of your head. Your vision may also be checked. How is this treated? This condition may be treated with lifestyle changes and with medicines that help relieve symptoms. Follow these instructions at home: Managing pain  Take over-the-counter and prescription medicines only as told by your health care provider.  When you have a  headache, lie down in a dark, quiet room.  If directed, apply ice to the head and neck: ? Put ice in a plastic bag. ? Place a towel between your skin and the bag. ? Leave the ice on for 20 minutes, 2-3 times a day.  If directed, apply heat to the back of your neck as often as told by your health care provider. Use the heat source that your health care provider recommends, such as a moist heat pack or a heating pad. ? Place a towel between your skin and the heat source. ? Leave the heat on for 20-30 minutes. ? Remove the heat if your skin turns bright red. This is especially important if you are unable to feel pain, heat, or cold. You may have a greater risk of getting burned. Eating and drinking  Eat meals on a regular schedule.  Limit alcohol intake to no more than 1 drink a day for nonpregnant women and 2 drinks a day for men. One drink equals 12 oz of beer, 5 oz of wine, or 1 oz of hard liquor.  Drink enough fluid to keep your urine pale yellow.  Decrease your caffeine intake, or stop using caffeine. Lifestyle  Get 7-9 hours of sleep each night, or get the amount of sleep recommended by your health care provider.  At bedtime, remove all electronic devices from your room. Electronic devices include computers, phones, and tablets.  Find ways to manage your stress. Some things that can help relieve stress include: ? Exercise. ? Deep breathing exercises. ? Yoga. ? Listening to music. ? Positive mental imagery.  Try to sit up straight and avoid tensing your muscles.  Do not use any products that contain nicotine or tobacco, such as cigarettes and e-cigarettes. If you need help quitting, ask your health care provider. General instructions   Keep all follow-up visits as told by your health care provider. This is important.  Avoid any headache triggers. Keep a headache journal to help find out what may trigger your headaches. For example, write down: ? What you eat and  drink. ? How much sleep you get. ? Any change to your diet or medicines. Contact a health care provider if:  Your headache does not get better.  Your headache comes back.  You are sensitive to sounds, light, or smells because of a headache.  You have nausea or you vomit.  Your stomach hurts. Get help right away if:  You suddenly develop a very severe headache along with any of the following: ? A stiff neck. ? Nausea and vomiting. ? Confusion. ? Weakness. ? Double vision or loss  of vision. ? Shortness of breath. ? Rash. ? Unusual sleepiness. ? Fever. ? Trouble speaking. ? Pain in your eyes or ears. ? Trouble walking or balancing. ? Feeling faint or passing out. Summary  A tension headache is a feeling of pain, pressure, or aching in the head that is often felt over the front and sides of the head.  A tension headache can last from 30 minutes to several days. It is the most common kind of headache.  This condition may be diagnosed based on your symptoms, your medical history, and a physical exam.  This condition may be treated with lifestyle changes and with medicines that help relieve symptoms. This information is not intended to replace advice given to you by your health care provider. Make sure you discuss any questions you have with your health care provider. Document Revised: 09/28/2019 Document Reviewed: 03/13/2017 Elsevier Patient Education  2020 [employer].

## 2020-03-19 NOTE — Progress Notes (Addendum)
[facility] [address] [place]. [place] [address] [phone]       OFFICE CONSULT NOTE  Kimberly Watts Date of Birth:  July 08, 1961 Medical Record Number:  [id number]   Referring MD: Kimberly Watts Reason for Referral: Headache dizziness HPI: Kimberly Watts is a 59 year old African-American lady with past medical history of hypertension hyperlipidemia and statin intolerance as well as TIA seen today for referral for new complaints of headache and dizziness and blurred vision.  History is obtained from the patient, review of referral notes and electronic medical records and I personally reviewed available imaging films in PACS.  She states that since January of this year she has been having intermittent occipital headache and neck pain this at times spreads to involve her vertex as well as the face.  She at times feels dizzy, lightheaded and has blurred vision.  Occasionally she gets numbness on the face as well.  This was quite severe initially and she was seen by orthopedic surgeon who did some x-rays and referred her for some acupuncture injections in the neck which seem to help.  Symptoms resolved for a while but for the last 1 week the symptoms seem to have come back.  She describes the pain as constant pressure-like muscle pulling sensation.  She was prescribed muscle relaxant which made her sleepy but more recently she has been taking methocarbamol every 6-8 hourly which seems to help.  The pain is severe at times she often wakes up from sleep with it.  She denies any nausea vomiting but does state that light bothers and makes her headache and neck pain worse.  She denies any true radicular pain.  She denies history of recent head injury with loss of consciousness, stroke or TIA.  She had a CT scan of the head done on 01/03/2020 which I personally reviewed and was unremarkable.  She is currently seeing physical therapist and getting some neck exercises which seems to help.   She does complain of a lot of tightness and muscle stiffness in the back of her neck and top of the shoulders.  Application of local heat does seem to help her.  She does have remote history of a posterior circulation TIA in January 2019 at that time CT scan of the head as well as MRI scan did not show an acute stroke.  Carotid ultrasound showed 40 to 59% right distal ICA stenosis and transthoracic echo was unremarkable.  LDL cholesterol was 150 mg percent and hemoglobin A1c was 5.9.  She was seen in office for follow-up in March as well as September 2019.  ROS:   14 system review of systems is positive for headache, neck pain, blurred vision, numbness, dizziness, sleepiness and all other systems negative  PMH:  Past Medical History:  Diagnosis Date  . GERD (gastroesophageal reflux disease)   . High cholesterol   . Hypertension   . Thyroid disease     Social History:  Social History   Socioeconomic History  . Marital status: Married    Spouse name: Not on file  . Number of children: Not on file  . Years of education: Not on file  . Highest education level: Not on file  Occupational History  . Not on file  Tobacco Use  . Smoking status: Former Games developer  . Smokeless tobacco: Never Used  Substance and Sexual Activity  . Alcohol use: No  . Drug use: No  . Sexual activity: Not on file  Other Topics  Concern  . Not on file  Social History Narrative  . Not on file   Social Determinants of Health   Financial Resource Strain:   . Difficulty of Paying Living Expenses:   Food Insecurity:   . Worried About Charity fundraiser in the Last Year:   . Arboriculturist in the Last Year:   Transportation Needs:   . Film/video editor (Medical):   Marland Kitchen Lack of Transportation (Non-Medical):   Physical Activity:   . Days of Exercise per Week:   . Minutes of Exercise per Session:   Stress:   . Feeling of Stress :   Social Connections:   . Frequency of Communication with Friends and  Family:   . Frequency of Social Gatherings with Friends and Family:   . Attends Religious Services:   . Active Member of Clubs or Organizations:   . Attends Archivist Meetings:   Marland Kitchen Marital Status:   Intimate Partner Violence:   . Fear of Current or Ex-Partner:   . Emotionally Abused:   Marland Kitchen Physically Abused:   . Sexually Abused:     Medications:   Current Outpatient Medications on File Prior to Visit  Medication Sig Dispense Refill  . amLODipine-benazepril (LOTREL) 5-10 MG per capsule Take 1 capsule by mouth daily.    Marland Kitchen atenolol (TENORMIN) 25 MG tablet Take 50 mg by mouth daily.     . baclofen (LIORESAL) 20 MG tablet SMARTSIG:1 Tablet(s) By Mouth Every 8-12 Hours PRN    . calcium-vitamin D (OSCAL WITH D) 500-200 MG-UNIT per tablet Take 1 tablet by mouth daily.    . clopidogrel (PLAVIX) 75 MG tablet Take 1 tablet (75 mg total) by mouth daily. 30 tablet 3  . cyclobenzaprine (FLEXERIL) 10 MG tablet Take 10 mg by mouth every 8 (eight) hours as needed.    Marland Kitchen levothyroxine (SYNTHROID, LEVOTHROID) 50 MCG tablet Take 50 mcg by mouth at bedtime.  1  . meclizine (ANTIVERT) 12.5 MG tablet Take 1 tablet (12.5 mg total) by mouth 3 (three) times daily as needed for dizziness. 30 tablet 0  . methocarbamol (ROBAXIN) 500 MG tablet Take 500 mg by mouth every 6 (six) hours as needed.    Marland Kitchen omeprazole (PRILOSEC) 20 MG capsule Take 20 mg by mouth daily.    . simvastatin (ZOCOR) 80 MG tablet Take 1 tablet (80 mg total) by mouth daily. 90 tablet 0   No current facility-administered medications on file prior to visit.    Allergies:   Allergies  Allergen Reactions  . Adalat [Nifedipine]     Physical Exam General: Obese middle-aged African-American lady seated, in no evident distress Head: head normocephalic and atraumatic.   Neck: supple with no carotid or supraclavicular bruits Cardiovascular: regular rate and rhythm, no murmurs Musculoskeletal: no deformity mild tightness of posterior neck  and upper shoulder muscles with slight restriction of movements in all directions. Skin:  no rash/petichiae Vascular:  Normal pulses all extremities  Neurologic Exam Mental Status: Awake and fully alert. Oriented to place and time. Recent and remote memory intact. Attention span, concentration and fund of knowledge appropriate. Mood and affect appropriate.  Cranial Nerves: Fundoscopic exam reveals sharp disc margins. Pupils equal, briskly reactive to light. Extraocular movements full without nystagmus. Visual fields full to confrontation. Hearing intact. Facial sensation intact. Face, tongue, palate moves normally and symmetrically.  Motor: Normal bulk and tone. Normal strength in all tested extremity muscles. Sensory.: intact to touch , pinprick , position and  vibratory sensation.  Coordination: Rapid alternating movements normal in all extremities. Finger-to-nose and heel-to-shin performed accurately bilaterally. Gait and Station: Arises from chair without difficulty. Stance is normal. Gait demonstrates normal stride length and balance . Able to heel, toe and tandem walk with mild t difficulty.  Reflexes: 1+ and symmetric. Toes downgoing.       ASSESSMENT: 59 year old lady with new onset of headache and posterior neck pain since January 2021 likely muscle tension headaches.  Remote history of posterior circulation TIA in January 2019 with vascular risk factors of obesity, hypertension and hyperlipidemia.    PLAN: I had a long discussion with the patient regarding her neck pain and headaches for the last 3 months which sound like muscle tension headaches and neck pain but these are refractory hence recommend further evaluation by checking MRI scan of the brain and cervical spine.  I recommend she do regular neck stretching exercises as well as use methocarbamol as needed.  Trial of Topamax 50 mg daily for a week to be increased to twice daily as tolerated.  She will continue Plavix for stroke  prevention and maintain aggressive risk factor modification with strict control of hypertension with blood pressure goal below 130/90, lipids with LDL cholesterol goal below 70 mg percent and diabetes with hemoglobin A1c goal below 6.5%.  She will return for follow-up in the future in 3 months with Shanda Bumps my nurse practitioner on call earlier if necessary.  Greater than 50% time during this 50-minute consultation visit was spent on counseling and coordination of care about her headache and neck pain and remote stroke and answering questions Delia Heady, MD Note: This document was prepared with digital dictation and possible smart phrase technology. Any transcriptional errors that result from this process are unintentional.

## 2020-03-20 ENCOUNTER — Telehealth: Payer: Self-pay | Admitting: Neurology

## 2020-03-20 ENCOUNTER — Other Ambulatory Visit: Payer: Self-pay

## 2020-03-20 MED ORDER — TOPIRAMATE 50 MG PO TABS: 50.0000 mg | ORAL_TABLET | Freq: Two times a day (BID) | ORAL | 2 refills | Status: DC

## 2020-03-20 NOTE — Telephone Encounter (Signed)
Pt has called to inform the topiramate (TOPAMAX) 50 MG tablet will need to be called back into [id number] because yesterday the pharmacy was without power

## 2020-03-20 NOTE — Telephone Encounter (Signed)
Medication resent again to [id number].

## 2020-03-26 ENCOUNTER — Telehealth: Payer: Self-pay | Admitting: Neurology

## 2020-03-26 NOTE — Telephone Encounter (Signed)
I spoke to the patient due to the cost she is going to wait. I did offer her the payment plan.  [facility] Auth: NPR Ref # [id number]

## 2020-03-29 ENCOUNTER — Ambulatory Visit: Payer: Commercial Managed Care - PPO | Attending: Internal Medicine

## 2020-03-29 ENCOUNTER — Telehealth: Payer: Self-pay | Admitting: Neurology

## 2020-03-29 DIAGNOSIS — Z23 Encounter for immunization: Secondary | ICD-10-CM

## 2020-03-29 NOTE — Telephone Encounter (Addendum)
I called Kimberly Watts at [facility] at [phone]. She stated on the note sent by Dr.Sethi its a different physici They want the referring MD  name on. the office consult note revised with Dr. Reubin Milan name on note and resent. I stated message will be sent to Dr.Sethi to change on note and resent. Kimberly Watts verbalized understanding. Kimberly Watts stated that really needs to be change so the right MD be contacted if needed.

## 2020-03-29 NOTE — Telephone Encounter (Signed)
Kimberly Watts from [facility] called stating that on the office notes that were faxed to her have the wrong referring physician on it. She states that listed on the OV notes is the wife of the provider but she was not the one to refer the pt. The correct provider needs to be Dr. Reubin Milan She would need this corrected and re faxed to her attention. Please advise.

## 2020-03-29 NOTE — Progress Notes (Signed)
   Covid-19 Vaccination Clinic  Name:  Kimberly Watts    MRN: [id number] DOB: 08/03/1961  03/29/2020  [name] was observed post Covid-19 immunization for 15 minutes without incident. She was provided with Vaccine Information Sheet and instruction to access the V-Safe system.   [name] was instructed to call 911 with any severe reactions post vaccine: Marland Kitchen Difficulty breathing  . Swelling of face and throat  . A fast heartbeat  . A bad rash all over body  . Dizziness and weakness   Immunizations Administered    Name Date Dose VIS Date Route   Pfizer COVID-19 Vaccine 03/29/2020  4:55 PM 0.3 mL 11/25/2019 Intramuscular   Manufacturer: [employer], [employer]   Lot: [id number]   NDC: [address]-3

## 2020-04-02 ENCOUNTER — Telehealth: Payer: Self-pay | Admitting: Neurology

## 2020-04-02 NOTE — Telephone Encounter (Signed)
Kimberly Watts from [facility] called wanting to inform RN that the faxed paperwork has been received for this pt.

## 2020-04-02 NOTE — Telephone Encounter (Signed)
I called Kimberly Watts at [facility] that referring MD name was change to Ruthe Mannan by Dr.SEthi today. I stated a fax was sent electronically by MD. I requested fax number to be sent by RN. I was given fax number of [phone]. I fax revised office notes with correct MD name and it was receive.

## 2020-04-02 NOTE — Telephone Encounter (Signed)
Noted  

## 2020-04-23 ENCOUNTER — Ambulatory Visit: Payer: Commercial Managed Care - PPO | Attending: Internal Medicine

## 2020-04-23 DIAGNOSIS — Z23 Encounter for immunization: Secondary | ICD-10-CM

## 2020-04-23 NOTE — Progress Notes (Signed)
   Covid-19 Vaccination Clinic  Name:  Kimberly Watts    MRN: [id number] DOB: October 01, 1961  04/23/2020  Ms. Kimberly Watts was observed post Covid-19 immunization for 15 minutes without incident. She was provided with Vaccine Information Sheet and instruction to access the V-Safe system.   Ms. Kimberly Watts was instructed to call 911 with any severe reactions post vaccine: Marland Kitchen Difficulty breathing  . Swelling of face and throat  . A fast heartbeat  . A bad rash all over body  . Dizziness and weakness   Immunizations Administered    Name Date Dose VIS Date Route   Pfizer COVID-19 Vaccine 04/23/2020  4:42 PM 0.3 mL 02/08/2019 Intramuscular   Manufacturer: [employer], [employer]   Lot: [id number]   NDC: [address]-9

## 2020-05-11 ENCOUNTER — Other Ambulatory Visit: Payer: Self-pay

## 2020-05-11 ENCOUNTER — Ambulatory Visit: Payer: Commercial Managed Care - PPO | Admitting: Podiatry

## 2020-05-11 ENCOUNTER — Ambulatory Visit (INDEPENDENT_AMBULATORY_CARE_PROVIDER_SITE_OTHER): Payer: Commercial Managed Care - PPO

## 2020-05-11 DIAGNOSIS — M2142 Flat foot [pes planus] (acquired), left foot: Secondary | ICD-10-CM | POA: Diagnosis not present

## 2020-05-11 DIAGNOSIS — M2141 Flat foot [pes planus] (acquired), right foot: Secondary | ICD-10-CM

## 2020-05-11 DIAGNOSIS — M722 Plantar fascial fibromatosis: Secondary | ICD-10-CM | POA: Diagnosis not present

## 2020-05-17 ENCOUNTER — Encounter: Payer: Self-pay | Admitting: Podiatry

## 2020-05-17 NOTE — Progress Notes (Signed)
Subjective:  Patient ID: Kimberly Watts, female    DOB: 1961/05/30,  MRN: [id number]  Chief Complaint  Patient presents with  . Foot Pain    pt is here for bil foot pain, pt states that both of the foot pain is elevated to the touch, pt also states that it is at the bottom. back of both heels, going on for about a month.    59 y.o. female presents with the above complaint.  Patient presents with bilateral heel pain that has been going on for about a month has progressive gotten worse.  Patient states that she has a history of plantar fasciitis and it seems to have come back again.  Her pain scale is 8 out of 10.  There is no static dyskinesia type of symptoms.  She states is painful to walk on.  She has not tried any other treatment options.  She would like to discuss further treatment options.  She has not seen anyone else prior to seeing me for this new onset of plantar fasciitis.   Review of Systems: Negative except as noted in the HPI. Denies N/V/F/Ch.  Past Medical History:  Diagnosis Date  . GERD (gastroesophageal reflux disease)   . High cholesterol   . Hypertension   . Thyroid disease     Current Outpatient Medications:  .  amLODipine-benazepril (LOTREL) 5-10 MG per capsule, Take 1 capsule by mouth daily., Disp: , Rfl:  .  atenolol (TENORMIN) 25 MG tablet, Take 50 mg by mouth daily. , Disp: , Rfl:  .  baclofen (LIORESAL) 20 MG tablet, SMARTSIG:1 Tablet(s) By Mouth Every 8-12 Hours PRN, Disp: , Rfl:  .  calcium-vitamin D (OSCAL WITH D) 500-200 MG-UNIT per tablet, Take 1 tablet by mouth daily., Disp: , Rfl:  .  clopidogrel (PLAVIX) 75 MG tablet, Take 1 tablet (75 mg total) by mouth daily., Disp: 30 tablet, Rfl: 3 .  cyclobenzaprine (FLEXERIL) 10 MG tablet, Take 10 mg by mouth every 8 (eight) hours as needed., Disp: , Rfl:  .  levothyroxine (SYNTHROID, LEVOTHROID) 50 MCG tablet, Take 50 mcg by mouth at bedtime., Disp: , Rfl: 1 .  meclizine (ANTIVERT) 12.5 MG tablet, Take 1  tablet (12.5 mg total) by mouth 3 (three) times daily as needed for dizziness., Disp: 30 tablet, Rfl: 0 .  methocarbamol (ROBAXIN) 500 MG tablet, Take 500 mg by mouth every 6 (six) hours as needed., Disp: , Rfl:  .  omeprazole (PRILOSEC) 20 MG capsule, Take 20 mg by mouth daily., Disp: , Rfl:  .  simvastatin (ZOCOR) 80 MG tablet, Take 1 tablet (80 mg total) by mouth daily., Disp: 90 tablet, Rfl: 0 .  topiramate (TOPAMAX) 50 MG tablet, Take 1 tablet (50 mg total) by mouth 2 (two) times daily. Start 1 tablet daily x 1 week and then twice daily, Disp: 60 tablet, Rfl: 2  Social History   Tobacco Use  Smoking Status Former Smoker  Smokeless Tobacco Never Used    Allergies  Allergen Reactions  . Adalat [Nifedipine]    Objective:  There were no vitals filed for this visit. There is no height or weight on file to calculate BMI. Constitutional Well developed. Well nourished.  Vascular Dorsalis pedis pulses palpable bilaterally. Posterior tibial pulses palpable bilaterally. Capillary refill normal to all digits.  No cyanosis or clubbing noted. Pedal hair growth normal.  Neurologic Normal speech. Oriented to person, place, and time. Epicritic sensation to light touch grossly present bilaterally.  Dermatologic Nails well groomed  and normal in appearance. No open wounds. No skin lesions.  Orthopedic: Normal joint ROM without pain or crepitus bilaterally. No visible deformities. Tender to palpation at the calcaneal tuber bilaterally. No pain with calcaneal squeeze bilaterally. Ankle ROM full range of motion bilaterally. Silfverskiold Test: negative bilaterally.   Radiographs: Taken and reviewed. No acute fractures or dislocations. No evidence of stress fracture.  Plantar heel spur absent. Posterior heel spur absent.   Assessment:   1. Plantar fasciitis    Plan:  Patient was evaluated and treated and all questions answered.  Plantar Fasciitis, bilaterally - XR reviewed as above.    - Educated on icing and stretching. Instructions given.  - Injection delivered to the plantar fascia as below. - DME: Plantar Fascial Brace x2 - Pharmacologic management: Meloxicam/Medrol Dose Pak. Educated on risks/benefits and proper taking of medication.  Pes planus deformity -I explained to patient the etiology of pes planus deformity and various treatment options were extensively discussed.  I explained to her that the primary driving forces of pes planus foot structure.  I believe patient will benefit from custom-made orthotics and I will plan on discussing it further during next clinic visit if there is improvement of plantar fasciitis.  Procedure: Injection Tendon/Ligament Location: Bilateral plantar fascia at the glabrous junction; medial approach. Skin Prep: alcohol Injectate: 0.5 cc 0.5% marcaine plain, 0.5 cc of 1% Lidocaine, 0.5 cc kenalog 10. Disposition: Patient tolerated procedure well. Injection site dressed with a band-aid.  No follow-ups on file.

## 2020-05-18 ENCOUNTER — Other Ambulatory Visit: Payer: Self-pay | Admitting: Podiatry

## 2020-05-18 DIAGNOSIS — M722 Plantar fascial fibromatosis: Secondary | ICD-10-CM

## 2020-06-11 ENCOUNTER — Other Ambulatory Visit: Payer: Self-pay | Admitting: Neurology

## 2020-06-15 ENCOUNTER — Ambulatory Visit: Payer: Commercial Managed Care - PPO | Admitting: Podiatry

## 2020-06-15 ENCOUNTER — Other Ambulatory Visit: Payer: Self-pay

## 2020-06-15 DIAGNOSIS — Q666 Other congenital valgus deformities of feet: Secondary | ICD-10-CM

## 2020-06-15 DIAGNOSIS — M722 Plantar fascial fibromatosis: Secondary | ICD-10-CM | POA: Diagnosis not present

## 2020-06-19 ENCOUNTER — Encounter: Payer: Self-pay | Admitting: Podiatry

## 2020-06-19 NOTE — Progress Notes (Signed)
Subjective:  Patient ID: Kimberly Watts, female    DOB: 1961/02/24,  MRN: [id number]  Chief Complaint  Patient presents with   Foot Pain    pt is here for a f/u of plantar fasciitis    59 y.o. female presents with the above complaint.  Patient presents with about follow-up of bilateral plantar fasciitis.  Patient states the right side has resolved completely however the left side has not gone down at all.  Patient states the injection helped a little bit however the left side is still very painful.  Patient states the plantar fascial braces are helping.  The pain scale 6 out of 10.  Is dull achy in nature.  She denies any other acute complaints.   Review of Systems: Negative except as noted in the HPI. Denies N/V/F/Ch.  Past Medical History:  Diagnosis Date   GERD (gastroesophageal reflux disease)    High cholesterol    Hypertension    Thyroid disease     Current Outpatient Medications:    amLODipine-benazepril (LOTREL) 5-10 MG per capsule, Take 1 capsule by mouth daily., Disp: , Rfl:    atenolol (TENORMIN) 25 MG tablet, Take 50 mg by mouth daily. , Disp: , Rfl:    baclofen (LIORESAL) 20 MG tablet, SMARTSIG:1 Tablet(s) By Mouth Every 8-12 Hours PRN, Disp: , Rfl:    calcium-vitamin D (OSCAL WITH D) 500-200 MG-UNIT per tablet, Take 1 tablet by mouth daily., Disp: , Rfl:    clopidogrel (PLAVIX) 75 MG tablet, Take 1 tablet (75 mg total) by mouth daily., Disp: 30 tablet, Rfl: 3   cyclobenzaprine (FLEXERIL) 10 MG tablet, Take 10 mg by mouth every 8 (eight) hours as needed., Disp: , Rfl:    levothyroxine (SYNTHROID, LEVOTHROID) 50 MCG tablet, Take 50 mcg by mouth at bedtime., Disp: , Rfl: 1   meclizine (ANTIVERT) 12.5 MG tablet, Take 1 tablet (12.5 mg total) by mouth 3 (three) times daily as needed for dizziness., Disp: 30 tablet, Rfl: 0   methocarbamol (ROBAXIN) 500 MG tablet, Take 500 mg by mouth every 6 (six) hours as needed., Disp: , Rfl:    omeprazole (PRILOSEC) 20 MG  capsule, Take 20 mg by mouth daily., Disp: , Rfl:    simvastatin (ZOCOR) 80 MG tablet, Take 1 tablet (80 mg total) by mouth daily., Disp: 90 tablet, Rfl: 0   topiramate (TOPAMAX) 50 MG tablet, Take 1 tablet (50 mg total) by mouth 2 (two) times daily. Start 1 tablet daily x 1 week and then twice daily, Disp: 60 tablet, Rfl: 0  Social History   Tobacco Use  Smoking Status Former Smoker  Smokeless Tobacco Never Used    Allergies  Allergen Reactions   Adalat [Nifedipine]    Objective:  There were no vitals filed for this visit. There is no height or weight on file to calculate BMI. Constitutional Well developed. Well nourished.  Vascular Dorsalis pedis pulses palpable bilaterally. Posterior tibial pulses palpable bilaterally. Capillary refill normal to all digits.  No cyanosis or clubbing noted. Pedal hair growth normal.  Neurologic Normal speech. Oriented to person, place, and time. Epicritic sensation to light touch grossly present bilaterally.  Dermatologic Nails well groomed and normal in appearance. No open wounds. No skin lesions.  Orthopedic: Normal joint ROM without pain or crepitus bilaterally. No visible deformities. Tender to palpation at the calcaneal tuber left side.  Right side completely resolved No pain with calcaneal squeeze bilaterally. Ankle ROM full range of motion bilaterally. Silfverskiold Test: negative bilaterally.  Radiographs: Taken and reviewed. No acute fractures or dislocations. No evidence of stress fracture.  Plantar heel spur absent. Posterior heel spur absent.   Assessment:   1. Plantar fasciitis of right foot   2. Plantar fasciitis of left foot   3. Pes planovalgus    Plan:  Patient was evaluated and treated and all questions answered.  Right plantar fasciitis -Resolved. -Discussed with her shoe gear modification and the importance of them. -Continue wearing plantar fascial braces and stretching exercises for long-term  management.  Plantar Fasciitis, bilaterally - XR reviewed as above.  - Educated on icing and stretching. Instructions given.  -Second injection delivered to the plantar fascia as below. - DME: Night splint x1 - Pharmacologic management: None  Pes planus deformity -I explained to patient the etiology of pes planus deformity and various treatment options were extensively discussed.  I explained to her that the primary driving forces of pes planus foot structure.  I believe patient will benefit from custom-made orthotics. -Should be scheduled see rec for custom-made orthotics.  Procedure: Injection Tendon/Ligament Location: Bilateral plantar fascia at the glabrous junction; medial approach. Skin Prep: alcohol Injectate: 0.5 cc 0.5% marcaine plain, 0.5 cc of 1% Lidocaine, 0.5 cc kenalog 10. Disposition: Patient tolerated procedure well. Injection site dressed with a band-aid.  No follow-ups on file.

## 2020-06-21 ENCOUNTER — Other Ambulatory Visit: Payer: Self-pay | Admitting: Family Medicine

## 2020-06-21 DIAGNOSIS — Z1231 Encounter for screening mammogram for malignant neoplasm of breast: Secondary | ICD-10-CM

## 2020-07-02 ENCOUNTER — Other Ambulatory Visit: Payer: Commercial Managed Care - PPO | Admitting: Orthotics

## 2020-07-03 ENCOUNTER — Other Ambulatory Visit: Payer: Self-pay

## 2020-07-03 ENCOUNTER — Ambulatory Visit
Admission: RE | Admit: 2020-07-03 | Discharge: 2020-07-03 | Disposition: A | Discharge status: Home / Self Care | Payer: Commercial Managed Care - PPO | Source: Ambulatory Visit | Attending: Family Medicine | Admitting: Family Medicine

## 2020-07-03 DIAGNOSIS — Z1231 Encounter for screening mammogram for malignant neoplasm of breast: Secondary | ICD-10-CM

## 2020-07-12 ENCOUNTER — Other Ambulatory Visit: Payer: Self-pay

## 2020-07-12 ENCOUNTER — Encounter: Payer: Self-pay | Admitting: Adult Health

## 2020-07-12 ENCOUNTER — Ambulatory Visit: Payer: Commercial Managed Care - PPO | Admitting: Adult Health

## 2020-07-12 VITALS — BP 160/104 | HR 59 | Ht 65.0 in | Wt 266.0 lb

## 2020-07-12 DIAGNOSIS — G44209 Tension-type headache, unspecified, not intractable: Secondary | ICD-10-CM | POA: Diagnosis not present

## 2020-07-12 DIAGNOSIS — G459 Transient cerebral ischemic attack, unspecified: Secondary | ICD-10-CM | POA: Diagnosis not present

## 2020-07-12 MED ORDER — TOPIRAMATE 50 MG PO TABS: 25.0000 mg | ORAL_TABLET | Freq: Two times a day (BID) | ORAL | 3 refills | Status: AC

## 2020-07-12 NOTE — Progress Notes (Signed)
[facility] [address] [place]. [place] [address] [phone]       OFFICE FOLLOW UP NOTE  Kimberly Watts Date of Birth:  March 24, 1961 Medical Record Number:  [id number]   Referring MD: Reubin Milan Reason for Referral: Headache   Chief complaint: Chief Complaint  Patient presents with  . Follow-up    rm 5 here for a f/u on headached and neck pain. Pt is having no new sx.      HPI:   Today, 07/12/2020, Ms. Fullam returns for follow-up regarding new onset headaches  Since prior visit, headaches initially improved but returned over the past 2 weeks.  Use Topamax for only 2-week duration as she misunderstood instructions.  She does report "odd" sensation in her hands after taking dosage.  Continues to experience bilateral upper trapezius pain occasionally radiating into lower trapezius.  Denies radiculopathy.  Slight limitation of neck range of motion but denies increased pain with range of motion.  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  Occasionally experiences blurred vision with severe headache and photophobia but denies N/V or phonophobia.  MRI brain and MRI cervical has not been completed due to financial reasons  Hx of TIA: Continues on Plavix and simvastatin without side effects.  Blood pressure today initially elevated 179/84 and on recheck 160/104.  She has not recently monitor blood pressure at home but does have capability.  Continues to follow with PCP for HTN and HLD management    History provided for reference purposes only Initial consult visit 03/19/2020 Dr. Pearlean Brownie: Ms. Kagan is a 59 year old African-American lady with past medical history of hypertension hyperlipidemia and statin intolerance as well as TIA seen today for referral for new complaints of headache and dizziness and blurred vision.  History is obtained from the patient, review of referral notes and  electronic medical records and I personally reviewed available imaging films in PACS.  She states that since January of this year she has been having intermittent occipital headache and neck pain this at times spreads to involve her vertex as well as the face.  She at times feels dizzy, lightheaded and has blurred vision.  Occasionally she gets numbness on the face as well.  This was quite severe initially and she was seen by orthopedic surgeon who did some x-rays and referred her for some acupuncture injections in the neck which seem to help.  Symptoms resolved for a while but for the last 1 week the symptoms seem to have come back.  She describes the pain as constant pressure-like muscle pulling sensation.  She was prescribed muscle relaxant which made her sleepy but more recently she has been taking methocarbamol every 6-8 hourly which seems to help.  The pain is severe at times she often wakes up from sleep with it.  She denies any nausea vomiting but does state that light bothers and makes her headache and neck pain worse.  She denies any true radicular pain.  She denies history of recent head injury with loss of consciousness, stroke or TIA.  She had a CT scan of the head done on 01/03/2020 which I personally reviewed and was unremarkable.  She is currently seeing physical therapist and getting some neck exercises which seems to help.  She does complain of a lot of tightness and muscle stiffness in the back of her neck and top of the shoulders.  Application  of local heat does seem to help her.  She does have remote history of a posterior circulation TIA in January 2019 at that time CT scan of the head as well as MRI scan did not show an acute stroke.  Carotid ultrasound showed 40 to 59% right distal ICA stenosis and transthoracic echo was unremarkable.  LDL cholesterol was 150 mg percent and hemoglobin A1c was 5.9.  She was seen in office for follow-up in March as well as September 2019.  ROS:   14 system  review of systems is positive for headache, neck pain, blurred vision and all other systems negative  PMH:  Past Medical History:  Diagnosis Date  . GERD (gastroesophageal reflux disease)   . High cholesterol   . Hypertension   . Thyroid disease     Social History:  Social History   Socioeconomic History  . Marital status: Married    Spouse name: Not on file  . Number of children: Not on file  . Years of education: Not on file  . Highest education level: Not on file  Occupational History  . Not on file  Tobacco Use  . Smoking status: Former Games developermoker  . Smokeless tobacco: Never Used  Vaping Use  . Vaping Use: Never used  Substance and Sexual Activity  . Alcohol use: No  . Drug use: No  . Sexual activity: Not on file  Other Topics Concern  . Not on file  Social History Narrative  . Not on file   Social Determinants of Health   Financial Resource Strain:   . Difficulty of Paying Living Expenses:   Food Insecurity:   . Worried About Programme researcher, broadcasting/film/videounning Out of Food in the Last Year:   . Baristaan Out of Food in the Last Year:   Transportation Needs:   . Freight forwarderLack of Transportation (Medical):   Marland Kitchen. Lack of Transportation (Non-Medical):   Physical Activity:   . Days of Exercise per Week:   . Minutes of Exercise per Session:   Stress:   . Feeling of Stress :   Social Connections:   . Frequency of Communication with Friends and Family:   . Frequency of Social Gatherings with Friends and Family:   . Attends Religious Services:   . Active Member of Clubs or Organizations:   . Attends BankerClub or Organization Meetings:   Marland Kitchen. Marital Status:   Intimate Partner Violence:   . Fear of Current or Ex-Partner:   . Emotionally Abused:   Marland Kitchen. Physically Abused:   . Sexually Abused:     Medications:   Current Outpatient Medications on File Prior to Visit  Medication Sig Dispense Refill  . amLODipine-benazepril (LOTREL) 5-10 MG per capsule Take 1 capsule by mouth daily.    Marland Kitchen. atenolol (TENORMIN) 25 MG  tablet Take 50 mg by mouth daily.     . baclofen (LIORESAL) 20 MG tablet SMARTSIG:1 Tablet(s) By Mouth Every 8-12 Hours PRN    . calcium-vitamin D (OSCAL WITH D) 500-200 MG-UNIT per tablet Take 1 tablet by mouth daily.    . clopidogrel (PLAVIX) 75 MG tablet Take 1 tablet (75 mg total) by mouth daily. 30 tablet 3  . cyclobenzaprine (FLEXERIL) 10 MG tablet Take 10 mg by mouth every 8 (eight) hours as needed.    Marland Kitchen. levothyroxine (SYNTHROID, LEVOTHROID) 50 MCG tablet Take 50 mcg by mouth at bedtime.  1  . meclizine (ANTIVERT) 12.5 MG tablet Take 1 tablet (12.5 mg total) by mouth 3 (three) times daily as needed  for dizziness. 30 tablet 0  . methocarbamol (ROBAXIN) 500 MG tablet Take 500 mg by mouth every 6 (six) hours as needed.    Marland Kitchen omeprazole (PRILOSEC) 20 MG capsule Take 20 mg by mouth daily.    . simvastatin (ZOCOR) 80 MG tablet Take 1 tablet (80 mg total) by mouth daily. 90 tablet 0   No current facility-administered medications on file prior to visit.    Allergies:   Allergies  Allergen Reactions  . Adalat [Nifedipine]     Today's Vitals   07/12/20 0946 07/12/20 0948 07/12/20 1013  BP: (!) 179/84 (!) 180/83 (!) 160/104  Pulse: 59 59   Weight: (!) 266 lb (120.7 kg)    Height: 5\' 5"  (1.651 m)     Body mass index is 44.26 kg/m.    Physical Exam General: Obese pleasant middle-aged African-American lady seated, in no evident distress Head: head normocephalic and atraumatic.   Neck: supple with no carotid or supraclavicular bruits Cardiovascular: regular rate and rhythm, no murmurs Musculoskeletal: moderate tightness of posterior neck and upper shoulder muscles with slight restriction of movements in all directions. Skin:  no rash/petichiae Vascular:  Normal pulses all extremities  Neurologic Exam Mental Status: Awake and fully alert. Oriented to place and time. Recent and remote memory intact. Attention span, concentration and fund of knowledge appropriate. Mood and affect  appropriate.  Cranial Nerves: Pupils equal, briskly reactive to light. Extraocular movements full without nystagmus. Visual fields full to confrontation. Hearing intact. Facial sensation intact. Face, tongue, palate moves normally and symmetrically.  Motor: Normal bulk and tone. Normal strength in all tested extremity muscles. Sensory.: intact to touch , pinprick , position and vibratory sensation.  Coordination: Rapid alternating movements normal in all extremities. Finger-to-nose and heel-to-shin performed accurately bilaterally. Gait and Station: Arises from chair without difficulty. Stance is normal. Gait demonstrates normal stride length and balance . Able to heel, toe and tandem walk with mild  difficulty.  Reflexes: 1+ and symmetric. Toes downgoing.       ASSESSMENT/PLAN: 59 year old lady with new onset of headache and posterior neck pain since January 2021 likely muscle tension headaches.  Remote history of posterior circulation TIA in January 2019 with vascular risk factors of obesity, hypertension and hyperlipidemia.    NEW ONSET HEADACHE -MRI brain and cervical not completed due to financial reasons -Difficulty tolerating increased dose of Topamax.  Recommend trialing Topamax 25 mg twice daily but if continued difficulty tolerating, may trial gabapentin or tricyclic antidepressant -Offered referral to PT for dry needling but patient prefers to hold off at this time due to financial reasons -Discussed home treatments such as moist heat, neck exercises, ensuring adequate posture especially while sitting on computer and taking breaks when needed and ensuring she has a good pillow  HX OF TIA -Continue clopidogrel and simvastatin for secondary stroke prevention -Blood pressure elevated today and advised to ensure monitoring at home and to follow-up with PCP if remains elevated. ?HTN 2/2 headache vs worsening headache 2/2 HTN -Ensure close PCP follow-up for aggressive stroke risk factor  management with BP goal<130/90 and LDL goal<70 for HTN and HLD management.  No history of DM.   Follow-up in 6 months or call earlier if needed  I spent 20 minutes of face-to-face and non-face-to-face time with patient.  This included previsit chart review, lab review, study review, order entry, electronic health record documentation, patient education regarding new onset headache with recent worsening and further treatment options, prior history of stroke and importance of  managing stroke risk factors, and answered all questions to patient satisfaction  Ihor Austin, [facility]  [facility] [address] [place], [place] [address]  Phone [phone] Fax [phone] Note: This document was prepared with digital dictation and possible smart phrase technology. Any transcriptional errors that result from this process are unintentional.

## 2020-07-12 NOTE — Patient Instructions (Addendum)
Your Plan:  Trial Topamax 25 mg twice daily -if you have difficulty tolerating, please call the office to discuss different treatment options If you tolerate well after 1 week, we can trial increasing dosage further  You can also try over-the-counter treatments such as acetaminophen, Voltaren gel or other muscle pain creams  Pursue imaging once you are able  Please call office if you are interested in referral to PT for dry needling and traction  Please ensure you monitor your blood pressure at home and to follow-up with PCP if remains elevated    Follow-up in 6 months or call earlier if needed      Thank you for coming to see [place] at [facility]. I hope we have been able to provide you high quality care today.  You may receive a patient satisfaction survey over the next few weeks. We would appreciate your feedback and comments so that we may continue to improve ourselves and the health of our patients.

## 2020-07-12 NOTE — Progress Notes (Signed)
I agree with the above plan 

## 2020-07-16 ENCOUNTER — Ambulatory Visit: Payer: Commercial Managed Care - PPO | Admitting: Podiatry

## 2020-07-28 ENCOUNTER — Emergency Department (HOSPITAL_BASED_OUTPATIENT_CLINIC_OR_DEPARTMENT_OTHER)
Admission: EM | Admit: 2020-07-28 | Discharge: 2020-07-28 | Disposition: A | Discharge status: Home / Self Care | Payer: Commercial Managed Care - PPO | Attending: Emergency Medicine | Admitting: Emergency Medicine

## 2020-07-28 ENCOUNTER — Emergency Department (HOSPITAL_BASED_OUTPATIENT_CLINIC_OR_DEPARTMENT_OTHER): Payer: Commercial Managed Care - PPO

## 2020-07-28 ENCOUNTER — Encounter (HOSPITAL_BASED_OUTPATIENT_CLINIC_OR_DEPARTMENT_OTHER): Payer: Self-pay | Admitting: *Deleted

## 2020-07-28 ENCOUNTER — Other Ambulatory Visit: Payer: Self-pay

## 2020-07-28 DIAGNOSIS — Z79890 Hormone replacement therapy (postmenopausal): Secondary | ICD-10-CM | POA: Insufficient documentation

## 2020-07-28 DIAGNOSIS — Z87891 Personal history of nicotine dependence: Secondary | ICD-10-CM | POA: Diagnosis not present

## 2020-07-28 DIAGNOSIS — R42 Dizziness and giddiness: Secondary | ICD-10-CM | POA: Insufficient documentation

## 2020-07-28 DIAGNOSIS — E039 Hypothyroidism, unspecified: Secondary | ICD-10-CM | POA: Diagnosis not present

## 2020-07-28 DIAGNOSIS — Z79899 Other long term (current) drug therapy: Secondary | ICD-10-CM | POA: Insufficient documentation

## 2020-07-28 DIAGNOSIS — Z8673 Personal history of transient ischemic attack (TIA), and cerebral infarction without residual deficits: Secondary | ICD-10-CM | POA: Diagnosis not present

## 2020-07-28 DIAGNOSIS — R519 Headache, unspecified: Secondary | ICD-10-CM | POA: Diagnosis present

## 2020-07-28 DIAGNOSIS — G44209 Tension-type headache, unspecified, not intractable: Secondary | ICD-10-CM | POA: Diagnosis not present

## 2020-07-28 DIAGNOSIS — I10 Essential (primary) hypertension: Secondary | ICD-10-CM | POA: Insufficient documentation

## 2020-07-28 LAB — BASIC METABOLIC PANEL
Anion gap: 7 (ref 5–15)
BUN: 16 mg/dL (ref 6–20)
CO2: 24 mmol/L (ref 22–32)
Calcium: 9.5 mg/dL (ref 8.9–10.3)
Chloride: 106 mmol/L (ref 98–111)
Creatinine, Ser: 1.21 mg/dL — ABNORMAL HIGH (ref 0.44–1.00)
GFR calc Af Amer: 57 mL/min — ABNORMAL LOW (ref >60)
GFR calc non Af Amer: 49 mL/min — ABNORMAL LOW (ref >60)
Glucose, Bld: 99 mg/dL (ref 70–99)
Potassium: 3.7 mmol/L (ref 3.5–5.1)
Sodium: 137 mmol/L (ref 135–145)

## 2020-07-28 LAB — URINALYSIS, ROUTINE W REFLEX MICROSCOPIC
Bilirubin Urine: NEGATIVE
Glucose, UA: NEGATIVE mg/dL
Hgb urine dipstick: NEGATIVE
Ketones, ur: NEGATIVE mg/dL
Nitrite: NEGATIVE
Protein, ur: NEGATIVE mg/dL
Specific Gravity, Urine: 1.01 (ref 1.005–1.030)
pH: 7.5 (ref 5.0–8.0)

## 2020-07-28 LAB — CBC
HCT: 45.9 % (ref 36.0–46.0)
Hemoglobin: 14.5 g/dL (ref 12.0–15.0)
MCH: 31.7 pg (ref 26.0–34.0)
MCHC: 31.6 g/dL (ref 30.0–36.0)
MCV: 100.2 fL — ABNORMAL HIGH (ref 80.0–100.0)
Platelets: 268 10*3/uL (ref 150–400)
RBC: 4.58 MIL/uL (ref 3.87–5.11)
RDW: 12.9 % (ref 11.5–15.5)
WBC: 6.2 10*3/uL (ref 4.0–10.5)
nRBC: 0 % (ref 0.0–0.2)

## 2020-07-28 LAB — URINALYSIS, MICROSCOPIC (REFLEX)

## 2020-07-28 LAB — CBG MONITORING, ED: Glucose-Capillary: 119 mg/dL — ABNORMAL HIGH (ref 70–99)

## 2020-07-28 LAB — PREGNANCY, URINE: Preg Test, Ur: NEGATIVE

## 2020-07-28 MED ORDER — SODIUM CHLORIDE 0.9 % IV BOLUS
500.0000 mL | Freq: Once | INTRAVENOUS | Status: AC
  Administered 2020-07-28: 500 mL via INTRAVENOUS

## 2020-07-28 MED ORDER — DIPHENHYDRAMINE HCL 50 MG/ML IJ SOLN
12.5000 mg | Freq: Once | INTRAMUSCULAR | Status: AC
  Administered 2020-07-28: 12.5 mg via INTRAVENOUS
  Filled 2020-07-28: qty 1

## 2020-07-28 MED ORDER — METOCLOPRAMIDE HCL 5 MG/ML IJ SOLN
10.0000 mg | Freq: Once | INTRAMUSCULAR | Status: AC
  Administered 2020-07-28: 10 mg via INTRAVENOUS
  Filled 2020-07-28: qty 2

## 2020-07-28 NOTE — ED Provider Notes (Signed)
[facility] EMERGENCY DEPARTMENT Provider Note   CSN: [id number] Arrival date & time: 07/28/20  1533     History Chief Complaint  Patient presents with  . Headache    Kimberly Watts is a 59 y.o. female with past medical history of headache, hypertension, TIA, presenting emergency department with complaint of 2 weeks of her mid headache.  Headache starts at base of her head radiates up her scalp to the right side of her head.  She reports pain is a tightness aching pain.  She has associated lightheadedness.  Denies associated vision changes, photophobia, vomiting, fever.  She is treated her symptoms with Topamax without adequate relief.  She states she saw her neurologist 2 weeks ago and they decreased her Topamax dose due to side effects.   The history is provided by the patient.       Past Medical History:  Diagnosis Date  . GERD (gastroesophageal reflux disease)   . High cholesterol   . Hypertension   . Thyroid disease     Patient Active Problem List   Diagnosis Date Noted  . TIA (transient ischemic attack) 12/22/2017  . Essential hypertension 12/22/2017  . Hypothyroidism 12/22/2017  . Weakness 12/21/2017  . Acute cystitis without hematuria   . ACUTE PANCREATITIS 08/23/2009    Past Surgical History:  Procedure Laterality Date  . BREAST REDUCTION SURGERY    . REDUCTION MAMMAPLASTY Bilateral    in the 80's  . TUBAL LIGATION       OB History   No obstetric history on file.     Family History  Problem Relation Age of Onset  . Stroke Mother   . Diabetes Mother   . Hypertension Mother   . Diabetes Father   . Hypertension Father   . Heart attack Father     Social History   Tobacco Use  . Smoking status: Former Games developer  . Smokeless tobacco: Never Used  Vaping Use  . Vaping Use: Never used  Substance Use Topics  . Alcohol use: No  . Drug use: No    Home Medications Prior to Admission medications   Medication Sig Start Date End Date  Taking? Authorizing Provider  amLODipine-benazepril (LOTREL) 5-10 MG per capsule Take 1 capsule by mouth daily.    [provider]  atenolol (TENORMIN) 25 MG tablet Take 50 mg by mouth daily.     [provider]  baclofen (LIORESAL) 20 MG tablet SMARTSIG:1 Tablet(s) By Mouth Every 8-12 Hours PRN 02/21/20   [provider]  calcium-vitamin D (OSCAL WITH D) 500-200 MG-UNIT per tablet Take 1 tablet by mouth daily.    [provider]  clopidogrel (PLAVIX) 75 MG tablet Take 1 tablet (75 mg total) by mouth daily. 12/22/17   Rai, Delene Ruffini, MD  cyclobenzaprine (FLEXERIL) 10 MG tablet Take 10 mg by mouth every 8 (eight) hours as needed. 01/27/20   [provider]  levothyroxine (SYNTHROID, LEVOTHROID) 50 MCG tablet Take 50 mcg by mouth at bedtime. 10/11/17   [provider]  meclizine (ANTIVERT) 12.5 MG tablet Take 1 tablet (12.5 mg total) by mouth 3 (three) times daily as needed for dizziness. 12/29/19   Long, Arlyss Repress, MD  methocarbamol (ROBAXIN) 500 MG tablet Take 500 mg by mouth every 6 (six) hours as needed. 02/24/20   [provider]  omeprazole (PRILOSEC) 20 MG capsule Take 20 mg by mouth daily.    [provider]  simvastatin (ZOCOR) 80 MG tablet Take 1 tablet (  80 mg total) by mouth daily. 03/14/19   Ihor Austin, NP  topiramate (TOPAMAX) 50 MG tablet Take 0.5 tablets (25 mg total) by mouth 2 (two) times daily. Start 1 tablet daily x 1 week and then twice daily 07/12/20   Ihor Austin, NP    Allergies    Adalat [nifedipine]  Review of Systems   Review of Systems  Neurological: Positive for light-headedness and headaches.  All other systems reviewed and are negative.   Physical Exam Updated Vital Signs BP 123/69 (BP Location: Right Arm)   Pulse (!) 56   Temp 99.1 F (37.3 C) (Oral)   Resp 20   Ht 5\' 5"  (1.651 m)   Wt 120.7 kg   SpO2 99%   BMI 44.26 kg/m   Physical Exam Vitals and nursing note reviewed.    Constitutional:      General: She is not in acute distress.    Appearance: She is well-developed. She is not ill-appearing.  HENT:     Head: Normocephalic and atraumatic.  Eyes:     Conjunctiva/sclera: Conjunctivae normal.  Cardiovascular:     Rate and Rhythm: Normal rate and regular rhythm.  Pulmonary:     Effort: Pulmonary effort is normal. No respiratory distress.     Breath sounds: Normal breath sounds.  Abdominal:     Palpations: Abdomen is soft.  Musculoskeletal:     Cervical back: Normal range of motion and neck supple. No rigidity.  Skin:    General: Skin is warm.  Neurological:     Mental Status: She is alert.     Comments: Mental Status:  Alert, oriented, thought content appropriate, able to give a coherent history. Speech fluent without evidence of aphasia. Able to follow 2 step commands without difficulty.  Cranial Nerves intact Motor:  Normal tone. 5/5 strength in upper and lower extremities bilaterally including strong and equal grip strength and dorsiflexion/plantar flexion Sensory: grossly normal in all extremities.  Cerebellar: normal finger-to-nose with bilateral upper extremities CV: distal pulses palpable throughout    Psychiatric:        Behavior: Behavior normal.     ED Results / Procedures / Treatments   Labs (all labs ordered are listed, but only abnormal results are displayed) Labs Reviewed  BASIC METABOLIC PANEL - Abnormal; Notable for the following components:      Result Value   Creatinine, Ser 1.21 (*)    GFR calc non Af Amer 49 (*)    GFR calc Af Amer 57 (*)    All other components within normal limits  CBC - Abnormal; Notable for the following components:   MCV 100.2 (*)    All other components within normal limits  URINALYSIS, ROUTINE W REFLEX MICROSCOPIC - Abnormal; Notable for the following components:   Leukocytes,Ua MODERATE (*)    All other components within normal limits  URINALYSIS, MICROSCOPIC (REFLEX) - Abnormal; Notable for  the following components:   Bacteria, UA RARE (*)    All other components within normal limits  CBG MONITORING, ED - Abnormal; Notable for the following components:   Glucose-Capillary 119 (*)    All other components within normal limits  PREGNANCY, URINE    EKG EKG Interpretation  Date/Time:  Saturday July 28 2020 16:16:49 EDT Ventricular Rate:  63 PR Interval:  212 QRS Duration: 82 QT Interval:  398 QTC Calculation: 407 R Axis:   -8 Text Interpretation: Sinus rhythm with 1st degree A-V block Minimal voltage criteria for LVH, may be normal variant (  R in aVL ) Possible Anterior infarct , age undetermined Abnormal ECG No significant change since last tracing Confirmed by Susy Frizzle [phone]) on 07/28/2020 4:20:28 PM   Radiology CT Head Wo Contrast  Result Date: 07/28/2020 CLINICAL DATA:  Headache EXAM: CT HEAD WITHOUT CONTRAST TECHNIQUE: Contiguous axial images were obtained from the base of the skull through the vertex without intravenous contrast. COMPARISON:  None. FINDINGS: Brain: No evidence of acute territorial infarction, hemorrhage, hydrocephalus,extra-axial collection or mass lesion/mass effect. There is moderate dilatation the ventricles and sulci consistent with age-related atrophy. Mild low-attenuation changes in the deep white matter consistent with small vessel ischemia. Vascular: No hyperdense vessel or unexpected calcification. Skull: The skull is intact. No fracture or focal lesion identified. Sinuses/Orbits: The visualized paranasal sinuses and mastoid air cells are clear. The orbits and globes intact. Other: None IMPRESSION: No acute intracranial abnormality. Findings consistent with atrophy, advanced for age, and mild chronic small vessel ischemia Electronically Signed   By: Jonna Clark M.D.   On: 07/28/2020 22:32    Procedures Procedures (including critical care time)  Medications Ordered in ED Medications  sodium chloride 0.9 % bolus 500 mL (500 mLs  Intravenous New Bag/Given 07/28/20 2237)  metoCLOPramide (REGLAN) injection 10 mg (10 mg Intravenous Given 07/28/20 2223)  diphenhydrAMINE (BENADRYL) injection 12.5 mg (12.5 mg Intravenous Given 07/28/20 2223)    ED Course  I have reviewed the triage vital signs and the nursing notes.  Pertinent labs & imaging results that were available during my care of the patient were reviewed by me and considered in my medical decision making (see chart for details).  Clinical Course as of Jul 28 2329  Sat Jul 28, 2020  2300 Pt reports improvement in symptoms. She feels comfortable with discharge.   [JR]    Clinical Course User Index [JR] Hudsyn Barich, [place] N, PA-C   MDM Rules/Calculators/A&P                          Patient with history of chronic headache on Topamax, presenting with intermittent headache over the course of 2 weeks, not improved with her Topamax.  Of note she recently decreased her dosing of this medication with neurology due to undesirable side effects.  Pain is somewhat different than usual headache it is more right-sided though she notes a 6 out of 10 in severity.  No vision changes or focal neuro deficits.  Afebrile, well-appearing. CT scan is negative for acute findings.  Headache treated with improvement in the ED.  Recommend she follow-up with her neurologist to discuss preventative medication.  Continue treating at home, return precautions discussed.  She is agreeable to plan, appropriate for discharge.   Final Clinical Impression(s) / ED Diagnoses Final diagnoses:  Acute non intractable tension-type headache    Rx / DC Orders ED Discharge Orders    None       Melysa Schroyer, [place] N, PA-C 07/28/20 2333    Pollyann Savoy, MD 07/29/20 1355

## 2020-07-28 NOTE — ED Notes (Signed)
EDP at bedside  

## 2020-07-28 NOTE — Discharge Instructions (Addendum)
Please read instructions below. Stay hydrated. You can take over-the-counter medications or your prescribed medications as needed for headache. Schedule an appointment with your primary care provider/neurologist to follow up on your headache and discuss preventative treatment. Return to the ER for severely worsening headache, vision changes, fever, weakness or numbness, or new or concerning symptoms.

## 2020-07-28 NOTE — ED Triage Notes (Signed)
Pt has a hx of migraine and last saw neuro 2 weeks ago. States she has headache, head is "numb" and feels dizzy x 2 weeks. States this doesn't feel like her usual migraine. She has been taking tylenol and topamax

## 2020-07-28 NOTE — ED Notes (Signed)
Pt to CT

## 2020-08-13 ENCOUNTER — Other Ambulatory Visit: Payer: Commercial Managed Care - PPO | Admitting: Orthotics

## 2020-08-13 ENCOUNTER — Ambulatory Visit: Payer: Commercial Managed Care - PPO | Admitting: Podiatry

## 2020-08-23 ENCOUNTER — Telehealth: Payer: Self-pay | Admitting: Adult Health

## 2020-08-23 NOTE — Telephone Encounter (Signed)
Low suspicion for side effect regarding Topamax but she can try to decrease dosage to 25 mg nightly for a couple days and then discontinue to see if symptoms resolve.  If symptoms do not resolve, would recommend further evaluation by her PCP and restart Topamax.  If symptoms resolve, we can discuss further treatment options.

## 2020-08-23 NOTE — Telephone Encounter (Signed)
Spoke to pt Relayed message from NP  Pt voiced understanding  Pt call back in a few days to update [place]

## 2020-08-23 NOTE — Telephone Encounter (Signed)
Pt has called to report that her gums are swollen from topiramate (TOPAMAX) 50 MG tablet, please call.

## 2020-08-23 NOTE — Telephone Encounter (Signed)
Spoke to pt and she is having SE from ? Topamax, swollen, painful gums where meets teeth, (not bleeding).  She has been on topamax 25mg  po bid for about 6 wks now.  Is helping headaches.  Went to care placed on amoxicillin and prednisone.  Did not help.  Saw dentist today had cleaning, xrays nothing on there end.  ? sz med topamax cause.  Pt had been on spirolactone for 2wks had stopped due to SE aching muscles, this with swollen gums started a week after stopping that.  Please advise.

## 2020-08-23 NOTE — Telephone Encounter (Signed)
I called pt and LMVM for her to return call.   

## 2021-01-14 ENCOUNTER — Ambulatory Visit: Payer: Commercial Managed Care - PPO | Admitting: Adult Health

## 2021-01-18 ENCOUNTER — Ambulatory Visit: Payer: Commercial Managed Care - PPO | Admitting: Podiatry

## 2021-01-18 ENCOUNTER — Encounter: Payer: Self-pay | Admitting: Podiatry

## 2021-01-18 ENCOUNTER — Other Ambulatory Visit: Payer: Self-pay

## 2021-01-18 DIAGNOSIS — Q666 Other congenital valgus deformities of feet: Secondary | ICD-10-CM | POA: Diagnosis not present

## 2021-01-18 DIAGNOSIS — M722 Plantar fascial fibromatosis: Secondary | ICD-10-CM

## 2021-01-22 ENCOUNTER — Encounter: Payer: Self-pay | Admitting: Podiatry

## 2021-01-22 ENCOUNTER — Other Ambulatory Visit: Payer: Self-pay

## 2021-01-22 ENCOUNTER — Ambulatory Visit (INDEPENDENT_AMBULATORY_CARE_PROVIDER_SITE_OTHER): Payer: Commercial Managed Care - PPO | Admitting: Orthotics

## 2021-01-22 DIAGNOSIS — M722 Plantar fascial fibromatosis: Secondary | ICD-10-CM

## 2021-01-22 NOTE — Progress Notes (Signed)

## 2021-01-22 NOTE — Progress Notes (Signed)
Subjective:  Patient ID: Kimberly Watts, female    DOB: 11-Feb-1961,  MRN: [id number]  Chief Complaint  Patient presents with  . Foot Pain    PT stated that her feet are starting to bother her again. She is having a throbbing sensation and they ache all the time.    60 y.o. female presents with the above complaint.  Patient presents with a follow-up of bilateral plantar fasciitis recurrence.  Patient states that she is doing well.  However her foot started bothering her again.  There is some throbbing sensation.  She came in immediately to get it evaluated.  She would like to discuss prevention options.  She has not seen anyone else in between this time.  She denies any other acute complaints.   Review of Systems: Negative except as noted in the HPI. Denies N/V/F/Ch.  Past Medical History:  Diagnosis Date  . GERD (gastroesophageal reflux disease)   . High cholesterol   . Hypertension   . Thyroid disease     Current Outpatient Medications:  .  amLODipine-benazepril (LOTREL) 5-10 MG per capsule, Take 1 capsule by mouth daily., Disp: , Rfl:  .  atenolol (TENORMIN) 25 MG tablet, Take 50 mg by mouth daily. , Disp: , Rfl:  .  baclofen (LIORESAL) 20 MG tablet, SMARTSIG:1 Tablet(s) By Mouth Every 8-12 Hours PRN, Disp: , Rfl:  .  calcium-vitamin D (OSCAL WITH D) 500-200 MG-UNIT per tablet, Take 1 tablet by mouth daily., Disp: , Rfl:  .  clopidogrel (PLAVIX) 75 MG tablet, Take 1 tablet (75 mg total) by mouth daily., Disp: 30 tablet, Rfl: 3 .  cyclobenzaprine (FLEXERIL) 10 MG tablet, Take 10 mg by mouth every 8 (eight) hours as needed., Disp: , Rfl:  .  levothyroxine (SYNTHROID, LEVOTHROID) 50 MCG tablet, Take 50 mcg by mouth at bedtime., Disp: , Rfl: 1 .  meclizine (ANTIVERT) 12.5 MG tablet, Take 1 tablet (12.5 mg total) by mouth 3 (three) times daily as needed for dizziness., Disp: 30 tablet, Rfl: 0 .  methocarbamol (ROBAXIN) 500 MG tablet, Take 500 mg by mouth every 6 (six) hours as needed.,  Disp: , Rfl:  .  omeprazole (PRILOSEC) 20 MG capsule, Take 20 mg by mouth daily., Disp: , Rfl:  .  simvastatin (ZOCOR) 80 MG tablet, Take 1 tablet (80 mg total) by mouth daily., Disp: 90 tablet, Rfl: 0 .  topiramate (TOPAMAX) 50 MG tablet, Take 0.5 tablets (25 mg total) by mouth 2 (two) times daily. Start 1 tablet daily x 1 week and then twice daily, Disp: 90 tablet, Rfl: 3  Social History   Tobacco Use  Smoking Status Former Smoker  Smokeless Tobacco Never Used    Allergies  Allergen Reactions  . Adalat [Nifedipine]    Objective:  There were no vitals filed for this visit. There is no height or weight on file to calculate BMI. Constitutional Well developed. Well nourished.  Vascular Dorsalis pedis pulses palpable bilaterally. Posterior tibial pulses palpable bilaterally. Capillary refill normal to all digits.  No cyanosis or clubbing noted. Pedal hair growth normal.  Neurologic Normal speech. Oriented to person, place, and time. Epicritic sensation to light touch grossly present bilaterally.  Dermatologic Nails well groomed and normal in appearance. No open wounds. No skin lesions.  Orthopedic: Normal joint ROM without pain or crepitus bilaterally. No visible deformities. Tender to palpation at the calcaneal tuber bilaterally No pain with calcaneal squeeze bilaterally. Ankle ROM full range of motion bilaterally. Silfverskiold Test: negative bilaterally.  Radiographs: Taken and reviewed. No acute fractures or dislocations. No evidence of stress fracture.  Plantar heel spur absent. Posterior heel spur absent.   Assessment:   1. Plantar fasciitis of right foot   2. Plantar fasciitis of left foot   3. Pes planovalgus    Plan:  Patient was evaluated and treated and all questions answered.  Plantar Fasciitis, bilaterally~recurrence - XR reviewed as above.  - Educated on icing and stretching. Instructions given.  -Second injection delivered to the plantar fascia as  below. - DME: I have asked her to put her plantar fascial brace back on - Pharmacologic management: None  Pes planus deformity -I explained to patient the etiology of pes planus deformity and various treatment options were extensively discussed.  I explained to her that the primary driving forces of pes planus foot structure.  I believe patient will benefit from custom-made orthotics. -Should be scheduled see rec for custom-made orthotics.  Procedure: Injection Tendon/Ligament Location: Bilateral plantar fascia at the glabrous junction; medial approach. Skin Prep: alcohol Injectate: 0.5 cc 0.5% marcaine plain, 0.5 cc of 1% Lidocaine, 0.5 cc kenalog 10. Disposition: Patient tolerated procedure well. Injection site dressed with a band-aid.  No follow-ups on file.

## 2021-02-19 ENCOUNTER — Other Ambulatory Visit: Payer: Self-pay

## 2021-02-19 ENCOUNTER — Ambulatory Visit (INDEPENDENT_AMBULATORY_CARE_PROVIDER_SITE_OTHER): Payer: Commercial Managed Care - PPO | Admitting: Podiatry

## 2021-02-19 DIAGNOSIS — Q666 Other congenital valgus deformities of feet: Secondary | ICD-10-CM

## 2021-02-19 DIAGNOSIS — M1990 Unspecified osteoarthritis, unspecified site: Secondary | ICD-10-CM | POA: Insufficient documentation

## 2021-02-19 DIAGNOSIS — E7800 Pure hypercholesterolemia, unspecified: Secondary | ICD-10-CM | POA: Insufficient documentation

## 2021-02-19 DIAGNOSIS — K589 Irritable bowel syndrome without diarrhea: Secondary | ICD-10-CM | POA: Insufficient documentation

## 2021-02-19 DIAGNOSIS — N952 Postmenopausal atrophic vaginitis: Secondary | ICD-10-CM | POA: Insufficient documentation

## 2021-02-19 DIAGNOSIS — K5900 Constipation, unspecified: Secondary | ICD-10-CM | POA: Insufficient documentation

## 2021-02-19 DIAGNOSIS — K219 Gastro-esophageal reflux disease without esophagitis: Secondary | ICD-10-CM | POA: Insufficient documentation

## 2021-02-19 DIAGNOSIS — G44209 Tension-type headache, unspecified, not intractable: Secondary | ICD-10-CM | POA: Insufficient documentation

## 2021-02-19 DIAGNOSIS — N9489 Other specified conditions associated with female genital organs and menstrual cycle: Secondary | ICD-10-CM | POA: Insufficient documentation

## 2021-02-19 DIAGNOSIS — R7303 Prediabetes: Secondary | ICD-10-CM | POA: Insufficient documentation

## 2021-02-19 DIAGNOSIS — M722 Plantar fascial fibromatosis: Secondary | ICD-10-CM

## 2021-02-19 DIAGNOSIS — Z8673 Personal history of transient ischemic attack (TIA), and cerebral infarction without residual deficits: Secondary | ICD-10-CM | POA: Insufficient documentation

## 2021-02-19 DIAGNOSIS — F410 Panic disorder [episodic paroxysmal anxiety] without agoraphobia: Secondary | ICD-10-CM | POA: Insufficient documentation

## 2021-02-19 NOTE — Progress Notes (Signed)
Patient presents to be re-casted for orthotics.  A foam impression was casted for her right and left foot.  Patient is a size 10  Patient was upset that the molds were lost and no body reached out to her until the day in which she is suppose to get the inserts and I apologized to her and I stated that we would get them out today.  Patient will be contracted when the orthotics come in.

## 2021-03-05 ENCOUNTER — Other Ambulatory Visit: Payer: Self-pay

## 2021-03-05 ENCOUNTER — Ambulatory Visit (INDEPENDENT_AMBULATORY_CARE_PROVIDER_SITE_OTHER): Payer: Commercial Managed Care - PPO | Admitting: *Deleted

## 2021-03-05 DIAGNOSIS — M722 Plantar fascial fibromatosis: Secondary | ICD-10-CM

## 2021-03-05 DIAGNOSIS — Q666 Other congenital valgus deformities of feet: Secondary | ICD-10-CM

## 2021-03-05 NOTE — Progress Notes (Signed)
Patient presents today to pick up custom molded foot orthotics, diagnosed with Plantar fasciitis by Dr. Allena Katz.   Orthotics were dispensed and fit was satisfactory. Reviewed instructions for break-in and wear. Written instructions given to patient.  Patient will follow up as needed.

## 2021-03-22 ENCOUNTER — Ambulatory Visit: Payer: Commercial Managed Care - PPO | Admitting: Podiatry

## 2021-04-10 ENCOUNTER — Ambulatory Visit: Payer: Commercial Managed Care - PPO | Admitting: Podiatry

## 2021-04-10 ENCOUNTER — Other Ambulatory Visit: Payer: Self-pay

## 2021-04-10 DIAGNOSIS — Q666 Other congenital valgus deformities of feet: Secondary | ICD-10-CM | POA: Diagnosis not present

## 2021-04-10 DIAGNOSIS — M722 Plantar fascial fibromatosis: Secondary | ICD-10-CM | POA: Diagnosis not present

## 2021-04-11 ENCOUNTER — Encounter: Payer: Self-pay | Admitting: Podiatry

## 2021-04-11 NOTE — Progress Notes (Signed)
Subjective:  Patient ID: Kimberly Watts, female    DOB: 12-Sep-1961,  MRN: [id number]  Chief Complaint  Patient presents with  . Plantar Fasciitis    Follow up after wearing orthotics    60 y.o. female presents with the above complaint.  Patient presents for follow-up of bilateral plantar fasciitis.  She states that she is doing a little bit better orthotics seems to help left knee is hurting.  She denies any other acute complaints.  She just wanted to make sure the orthotics are doing well.  Review of Systems: Negative except as noted in the HPI. Denies N/V/F/Ch.  Past Medical History:  Diagnosis Date  . GERD (gastroesophageal reflux disease)   . High cholesterol   . Hypertension   . Thyroid disease     Current Outpatient Medications:  .  amLODipine-benazepril (LOTREL) 5-10 MG per capsule, Take 1 capsule by mouth daily., Disp: , Rfl:  .  atenolol (TENORMIN) 25 MG tablet, Take 50 mg by mouth daily. , Disp: , Rfl:  .  baclofen (LIORESAL) 20 MG tablet, SMARTSIG:1 Tablet(s) By Mouth Every 8-12 Hours PRN, Disp: , Rfl:  .  Calcium Carb-Cholecalciferol 600-200 MG-UNIT TABS, 1 tablet with food, Disp: , Rfl:  .  calcium-vitamin D (OSCAL WITH D) 500-200 MG-UNIT per tablet, Take 1 tablet by mouth daily., Disp: , Rfl:  .  clopidogrel (PLAVIX) 75 MG tablet, Take 1 tablet (75 mg total) by mouth daily., Disp: 30 tablet, Rfl: 3 .  conjugated estrogens (PREMARIN) vaginal cream, See admin instructions., Disp: , Rfl:  .  cyclobenzaprine (FLEXERIL) 10 MG tablet, Take 10 mg by mouth every 8 (eight) hours as needed., Disp: , Rfl:  .  dicyclomine (BENTYL) 20 MG tablet, 1 Tablet orally up to every 6 hours  as needed for abdominal cramps, Disp: , Rfl:  .  fluconazole (DIFLUCAN) 150 MG tablet, 1 TABLET ORALLY ONE DOSE, CAN REPEAT IN 3 DAYS 1 DAY, Disp: , Rfl:  .  fluticasone (FLONASE ALLERGY RELIEF) 50 MCG/ACT nasal spray, 1 spray in each nostril, Disp: , Rfl:  .  ibuprofen (ADVIL) 800 MG tablet, TAKE 1  TABLET BY MOUTH EVERY 8 HOURS WITH FOOD OR MILK AS NEEDED 30 DAYS, Disp: , Rfl:  .  levothyroxine (SYNTHROID, LEVOTHROID) 50 MCG tablet, Take 50 mcg by mouth at bedtime., Disp: , Rfl: 1 .  meclizine (ANTIVERT) 12.5 MG tablet, Take 1 tablet (12.5 mg total) by mouth 3 (three) times daily as needed for dizziness., Disp: 30 tablet, Rfl: 0 .  meloxicam (MOBIC) 15 MG tablet, 1 tablet, Disp: , Rfl:  .  methocarbamol (ROBAXIN) 500 MG tablet, Take 500 mg by mouth every 6 (six) hours as needed., Disp: , Rfl:  .  omeprazole (PRILOSEC) 20 MG capsule, Take 20 mg by mouth daily., Disp: , Rfl:  .  simvastatin (ZOCOR) 80 MG tablet, Take 1 tablet (80 mg total) by mouth daily., Disp: 90 tablet, Rfl: 0 .  topiramate (TOPAMAX) 50 MG tablet, Take 0.5 tablets (25 mg total) by mouth 2 (two) times daily. Start 1 tablet daily x 1 week and then twice daily, Disp: 90 tablet, Rfl: 3  Social History   Tobacco Use  Smoking Status Former Smoker  Smokeless Tobacco Never Used    Allergies  Allergen Reactions  . Adalat [Nifedipine]    Objective:  There were no vitals filed for this visit. There is no height or weight on file to calculate BMI. Constitutional Well developed. Well nourished.  Vascular Dorsalis pedis  pulses palpable bilaterally. Posterior tibial pulses palpable bilaterally. Capillary refill normal to all digits.  No cyanosis or clubbing noted. Pedal hair growth normal.  Neurologic Normal speech. Oriented to person, place, and time. Epicritic sensation to light touch grossly present bilaterally.  Dermatologic Nails well groomed and normal in appearance. No open wounds. No skin lesions.  Orthopedic: Normal joint ROM without pain or crepitus bilaterally. No visible deformities. No tender to palpation at the calcaneal tuber bilaterally No pain with calcaneal squeeze bilaterally. Ankle ROM full range of motion bilaterally. Silfverskiold Test: negative bilaterally.   Radiographs: Taken and reviewed.  No acute fractures or dislocations. No evidence of stress fracture.  Plantar heel spur absent. Posterior heel spur absent.   Assessment:   1. Pes planovalgus   2. Plantar fasciitis of right foot   3. Plantar fasciitis of left foot    Plan:  Patient was evaluated and treated and all questions answered.  Plantar Fasciitis, bilaterally~recurrence - X clinically healing.  Continue discussing shoe gear modification as well as breaking the orthotics in.  Patient states understanding  Pes planus deformity -I explained to patient the etiology of pes planus deformity and various treatment options were extensively discussed.  I explained to her that the primary driving forces of pes planus foot structure.  I believe patient will benefit from custom-made orthotics. -Continue breaking the orthotics in.  If no resolve meant we can redo the orthotics    No follow-ups on file.

## 2021-06-20 ENCOUNTER — Ambulatory Visit: Payer: Commercial Managed Care - PPO | Attending: Internal Medicine

## 2021-06-20 ENCOUNTER — Other Ambulatory Visit: Payer: Self-pay

## 2021-06-20 ENCOUNTER — Other Ambulatory Visit (HOSPITAL_BASED_OUTPATIENT_CLINIC_OR_DEPARTMENT_OTHER): Payer: Self-pay

## 2021-06-20 DIAGNOSIS — Z23 Encounter for immunization: Secondary | ICD-10-CM

## 2021-06-20 MED ORDER — PFIZER-BIONT COVID-19 VAC-TRIS 30 MCG/0.3ML IM SUSP
INTRAMUSCULAR | 0 refills | Status: AC
  Filled 2021-06-20: qty 0.3, 1d supply, fill #0

## 2021-06-20 NOTE — Progress Notes (Signed)
   Covid-19 Vaccination Clinic  Name:  Kimberly Watts    MRN: [id number] DOB: 08-23-1961  06/20/2021  [name] was observed post Covid-19 immunization for 15 minutes without incident. She was provided with Vaccine Information Sheet and instruction to access the V-Safe system.   [name] was instructed to call 911 with any severe reactions post vaccine: Difficulty breathing  Swelling of face and throat  A fast heartbeat  A bad rash all over body  Dizziness and weakness   Immunizations Administered     Name Date Dose VIS Date Route   PFIZER Comrnaty(Gray TOP) Covid-19 Vaccine 06/20/2021  2:29 PM 0.3 mL 11/22/2020 Intramuscular   Manufacturer: [employer], [employer]   Lot: [id number]   NDC: [phone]

## 2021-07-10 ENCOUNTER — Ambulatory Visit: Payer: Commercial Managed Care - PPO | Admitting: Podiatry

## 2021-09-27 ENCOUNTER — Ambulatory Visit (INDEPENDENT_AMBULATORY_CARE_PROVIDER_SITE_OTHER): Payer: No Typology Code available for payment source | Admitting: Podiatry

## 2021-09-27 ENCOUNTER — Other Ambulatory Visit: Payer: Self-pay

## 2021-09-27 DIAGNOSIS — M722 Plantar fascial fibromatosis: Secondary | ICD-10-CM | POA: Diagnosis not present

## 2021-10-02 ENCOUNTER — Encounter: Payer: Self-pay | Admitting: Podiatry

## 2021-10-02 NOTE — Progress Notes (Signed)
Subjective:  Patient ID: Kimberly Watts, female    DOB: Dec 21, 1960,  MRN: [id number]  Chief Complaint  Patient presents with   Plantar Fasciitis    Bilateral foot pain     60 y.o. female presents with the above complaint.  Patient presents for follow-up of bilateral plantar fasciitis recurrence.  Patient states that it started giving her some trouble.  She has been doing little bit too much on her foot.  She wanted to discuss treatment options she has been wearing orthotics.  She denies any other acute complaints pain scale is 8 out of 10 dull achy in nature.   Review of Systems: Negative except as noted in the HPI. Denies N/V/F/Ch.  Past Medical History:  Diagnosis Date   GERD (gastroesophageal reflux disease)    High cholesterol    Hypertension    Thyroid disease     Current Outpatient Medications:    amLODipine-benazepril (LOTREL) 5-10 MG per capsule, Take 1 capsule by mouth daily., Disp: , Rfl:    atenolol (TENORMIN) 25 MG tablet, Take 50 mg by mouth daily. , Disp: , Rfl:    baclofen (LIORESAL) 20 MG tablet, SMARTSIG:1 Tablet(s) By Mouth Every 8-12 Hours PRN, Disp: , Rfl:    Calcium Carb-Cholecalciferol 600-200 MG-UNIT TABS, 1 tablet with food, Disp: , Rfl:    calcium-vitamin D (OSCAL WITH D) 500-200 MG-UNIT per tablet, Take 1 tablet by mouth daily., Disp: , Rfl:    clopidogrel (PLAVIX) 75 MG tablet, Take 1 tablet (75 mg total) by mouth daily., Disp: 30 tablet, Rfl: 3   conjugated estrogens (PREMARIN) vaginal cream, See admin instructions., Disp: , Rfl:    COVID-19 mRNA Vac-TriS, Pfizer, (PFIZER-BIONT COVID-19 VAC-TRIS) SUSP injection, Inject into the muscle., Disp: 0.3 mL, Rfl: 0   cyclobenzaprine (FLEXERIL) 10 MG tablet, Take 10 mg by mouth every 8 (eight) hours as needed., Disp: , Rfl:    dicyclomine (BENTYL) 20 MG tablet, 1 Tablet orally up to every 6 hours  as needed for abdominal cramps, Disp: , Rfl:    fluconazole (DIFLUCAN) 150 MG tablet, 1 TABLET ORALLY ONE DOSE, CAN  REPEAT IN 3 DAYS 1 DAY, Disp: , Rfl:    fluticasone (FLONASE ALLERGY RELIEF) 50 MCG/ACT nasal spray, 1 spray in each nostril, Disp: , Rfl:    ibuprofen (ADVIL) 800 MG tablet, TAKE 1 TABLET BY MOUTH EVERY 8 HOURS WITH FOOD OR MILK AS NEEDED 30 DAYS, Disp: , Rfl:    levothyroxine (SYNTHROID, LEVOTHROID) 50 MCG tablet, Take 50 mcg by mouth at bedtime., Disp: , Rfl: 1   meclizine (ANTIVERT) 12.5 MG tablet, Take 1 tablet (12.5 mg total) by mouth 3 (three) times daily as needed for dizziness., Disp: 30 tablet, Rfl: 0   meloxicam (MOBIC) 15 MG tablet, 1 tablet, Disp: , Rfl:    methocarbamol (ROBAXIN) 500 MG tablet, Take 500 mg by mouth every 6 (six) hours as needed., Disp: , Rfl:    omeprazole (PRILOSEC) 20 MG capsule, Take 20 mg by mouth daily., Disp: , Rfl:    simvastatin (ZOCOR) 80 MG tablet, Take 1 tablet (80 mg total) by mouth daily., Disp: 90 tablet, Rfl: 0   topiramate (TOPAMAX) 50 MG tablet, Take 0.5 tablets (25 mg total) by mouth 2 (two) times daily. Start 1 tablet daily x 1 week and then twice daily, Disp: 90 tablet, Rfl: 3  Social History   Tobacco Use  Smoking Status Former  Smokeless Tobacco Never    Allergies  Allergen Reactions   Adalat [Nifedipine]  Objective:  There were no vitals filed for this visit. There is no height or weight on file to calculate BMI. Constitutional Well developed. Well nourished.  Vascular Dorsalis pedis pulses palpable bilaterally. Posterior tibial pulses palpable bilaterally. Capillary refill normal to all digits.  No cyanosis or clubbing noted. Pedal hair growth normal.  Neurologic Normal speech. Oriented to person, place, and time. Epicritic sensation to light touch grossly present bilaterally.  Dermatologic Nails well groomed and normal in appearance. No open wounds. No skin lesions.  Orthopedic: Normal joint ROM without pain or crepitus bilaterally. No visible deformities. Tender to palpation at the calcaneal tuber bilaterally. No pain  with calcaneal squeeze bilaterally. Ankle ROM full range of motion bilaterally. Silfverskiold Test: negative bilaterally.   Radiographs: Taken and reviewed. No acute fractures or dislocations. No evidence of stress fracture.  Plantar heel spur absent. Posterior heel spur absent.   Assessment:   1. Plantar fasciitis of right foot   2. Plantar fasciitis of left foot     Plan:  Patient was evaluated and treated and all questions answered.  Plantar Fasciitis, bilaterally~recurrence - XR reviewed as above.  - Educated on icing and stretching. Instructions given.  - Injection delivered to the plantar fascia as below. - DME: Plantar Fascial Brace x2 - Pharmacologic management: None  Pes planus deformity -I explained to patient the etiology of pes planus deformity and various treatment options were extensively discussed.  I explained to her that the primary driving forces of pes planus foot structure.  I believe patient will benefit from custom-made orthotics and I will plan on discussing it further during next clinic visit if there is improvement of plantar fasciitis. -Orthotics are dispensed and functioning well  Procedure: Injection Tendon/Ligament Location: Bilateral plantar fascia at the glabrous junction; medial approach. Skin Prep: alcohol Injectate: 0.5 cc 0.5% marcaine plain, 0.5 cc of 1% Lidocaine, 0.5 cc kenalog 10. Disposition: Patient tolerated procedure well. Injection site dressed with a band-aid.  No follow-ups on file.

## 2021-10-25 ENCOUNTER — Ambulatory Visit: Payer: No Typology Code available for payment source | Admitting: Podiatry

## 2021-10-28 ENCOUNTER — Other Ambulatory Visit: Payer: Self-pay | Admitting: Family Medicine

## 2021-10-28 DIAGNOSIS — Z1231 Encounter for screening mammogram for malignant neoplasm of breast: Secondary | ICD-10-CM

## 2021-10-29 ENCOUNTER — Emergency Department (HOSPITAL_BASED_OUTPATIENT_CLINIC_OR_DEPARTMENT_OTHER)
Admission: EM | Admit: 2021-10-29 | Discharge: 2021-10-29 | Disposition: A | Discharge status: Home / Self Care | Payer: No Typology Code available for payment source | Attending: Emergency Medicine | Admitting: Emergency Medicine

## 2021-10-29 ENCOUNTER — Encounter (HOSPITAL_BASED_OUTPATIENT_CLINIC_OR_DEPARTMENT_OTHER): Payer: Self-pay | Admitting: *Deleted

## 2021-10-29 ENCOUNTER — Other Ambulatory Visit: Payer: Self-pay

## 2021-10-29 ENCOUNTER — Emergency Department (HOSPITAL_BASED_OUTPATIENT_CLINIC_OR_DEPARTMENT_OTHER): Payer: No Typology Code available for payment source

## 2021-10-29 ENCOUNTER — Ambulatory Visit
Admission: RE | Admit: 2021-10-29 | Discharge: 2021-10-29 | Disposition: A | Discharge status: Home / Self Care | Payer: No Typology Code available for payment source | Source: Ambulatory Visit | Attending: Family Medicine | Admitting: Family Medicine

## 2021-10-29 DIAGNOSIS — B9689 Other specified bacterial agents as the cause of diseases classified elsewhere: Secondary | ICD-10-CM | POA: Diagnosis not present

## 2021-10-29 DIAGNOSIS — Z87891 Personal history of nicotine dependence: Secondary | ICD-10-CM | POA: Insufficient documentation

## 2021-10-29 DIAGNOSIS — R109 Unspecified abdominal pain: Secondary | ICD-10-CM | POA: Diagnosis present

## 2021-10-29 DIAGNOSIS — Z1231 Encounter for screening mammogram for malignant neoplasm of breast: Secondary | ICD-10-CM

## 2021-10-29 DIAGNOSIS — K219 Gastro-esophageal reflux disease without esophagitis: Secondary | ICD-10-CM | POA: Insufficient documentation

## 2021-10-29 DIAGNOSIS — E039 Hypothyroidism, unspecified: Secondary | ICD-10-CM | POA: Diagnosis not present

## 2021-10-29 DIAGNOSIS — N760 Acute vaginitis: Secondary | ICD-10-CM | POA: Diagnosis not present

## 2021-10-29 DIAGNOSIS — I10 Essential (primary) hypertension: Secondary | ICD-10-CM | POA: Diagnosis not present

## 2021-10-29 DIAGNOSIS — Z7902 Long term (current) use of antithrombotics/antiplatelets: Secondary | ICD-10-CM | POA: Insufficient documentation

## 2021-10-29 DIAGNOSIS — Z79899 Other long term (current) drug therapy: Secondary | ICD-10-CM | POA: Insufficient documentation

## 2021-10-29 DIAGNOSIS — R1030 Lower abdominal pain, unspecified: Secondary | ICD-10-CM

## 2021-10-29 LAB — CBC WITH DIFFERENTIAL/PLATELET
Abs Immature Granulocytes: 0.02 10*3/uL (ref 0.00–0.07)
Basophils Absolute: 0 10*3/uL (ref 0.0–0.1)
Basophils Relative: 1 %
Eosinophils Absolute: 0.1 10*3/uL (ref 0.0–0.5)
Eosinophils Relative: 2 %
HCT: 45.5 % (ref 36.0–46.0)
Hemoglobin: 14.8 g/dL (ref 12.0–15.0)
Immature Granulocytes: 0 %
Lymphocytes Relative: 30 %
Lymphs Abs: 1.7 10*3/uL (ref 0.7–4.0)
MCH: 31.9 pg (ref 26.0–34.0)
MCHC: 32.5 g/dL (ref 30.0–36.0)
MCV: 98.1 fL (ref 80.0–100.0)
Monocytes Absolute: 0.6 10*3/uL (ref 0.1–1.0)
Monocytes Relative: 11 %
Neutro Abs: 3.2 10*3/uL (ref 1.7–7.7)
Neutrophils Relative %: 56 %
Platelets: 242 10*3/uL (ref 150–400)
RBC: 4.64 MIL/uL (ref 3.87–5.11)
RDW: 12.6 % (ref 11.5–15.5)
WBC: 5.7 10*3/uL (ref 4.0–10.5)
nRBC: 0 % (ref 0.0–0.2)

## 2021-10-29 LAB — WET PREP, GENITAL
Sperm: NONE SEEN
Trich, Wet Prep: NONE SEEN
WBC, Wet Prep HPF POC: >=10 — AB (ref <10)
Yeast Wet Prep HPF POC: NONE SEEN

## 2021-10-29 LAB — LIPASE, BLOOD: Lipase: 41 U/L (ref 11–51)

## 2021-10-29 LAB — COMPREHENSIVE METABOLIC PANEL
ALT: 20 U/L (ref 0–44)
AST: 18 U/L (ref 15–41)
Albumin: 4 g/dL (ref 3.5–5.0)
Alkaline Phosphatase: 79 U/L (ref 38–126)
Anion gap: 8 (ref 5–15)
BUN: 8 mg/dL (ref 6–20)
CO2: 26 mmol/L (ref 22–32)
Calcium: 9.4 mg/dL (ref 8.9–10.3)
Chloride: 102 mmol/L (ref 98–111)
Creatinine, Ser: 0.89 mg/dL (ref 0.44–1.00)
GFR, Estimated: >60 mL/min (ref >60)
Glucose, Bld: 94 mg/dL (ref 70–99)
Potassium: 3.9 mmol/L (ref 3.5–5.1)
Sodium: 136 mmol/L (ref 135–145)
Total Bilirubin: 0.8 mg/dL (ref 0.3–1.2)
Total Protein: 7.5 g/dL (ref 6.5–8.1)

## 2021-10-29 LAB — URINALYSIS, ROUTINE W REFLEX MICROSCOPIC
Bilirubin Urine: NEGATIVE
Glucose, UA: NEGATIVE mg/dL
Ketones, ur: NEGATIVE mg/dL
Nitrite: NEGATIVE
Protein, ur: NEGATIVE mg/dL
Specific Gravity, Urine: 1.01 (ref 1.005–1.030)
pH: 6 (ref 5.0–8.0)

## 2021-10-29 LAB — URINALYSIS, MICROSCOPIC (REFLEX)

## 2021-10-29 MED ORDER — METRONIDAZOLE 500 MG PO TABS: 500.0000 mg | ORAL_TABLET | Freq: Two times a day (BID) | ORAL | 0 refills | Status: AC

## 2021-10-29 MED ORDER — METRONIDAZOLE 500 MG PO TABS
500.0000 mg | ORAL_TABLET | Freq: Two times a day (BID) | ORAL | 0 refills | Status: DC
Start: 2021-10-29 — End: 2021-10-29

## 2021-10-29 MED ORDER — IOHEXOL 300 MG/ML  SOLN
100.0000 mL | Freq: Once | INTRAMUSCULAR | Status: AC | PRN
  Administered 2021-10-29: 100 mL via INTRAVENOUS

## 2021-10-29 NOTE — ED Notes (Signed)
Pt. Up walking to restroom with no help.

## 2021-10-29 NOTE — Discharge Instructions (Addendum)
Follow-up with your primary care doctor for recheck.  Return here as needed if you have any worsening symptoms. 

## 2021-10-29 NOTE — ED Triage Notes (Signed)
Abdominal pain. She was seen at [facility] x 3 days ago and was diagnosed with constipation. They gave her Golytely with good results. Her pain continues. She has an urge to have a BM.

## 2021-10-29 NOTE — ED Provider Notes (Signed)
[facility] EMERGENCY DEPARTMENT Provider Note   CSN: [id number] Arrival date & time: 10/29/21  1725     History Chief Complaint  Patient presents with   Abdominal Pain    RHIANN BOUCHER is a 60 y.o. female.  Patient is a 60 year old female who presents with abdominal pain.  She says she has had a 2-week history of pain across her lower abdomen.  Its been fairly constant over the last 2 weeks.  She has had some nausea and decreased appetite but no vomiting.  She was having normal bowel movements for her which is typically about every other day.  No urinary symptoms.  No known fevers.  No prior history of abdominal surgeries.  She was seen in urgent care 3 days ago.  They did an x-ray which showed evidence of constipation.  She was given GoLytely to use and she had bowel movements throughout the day.  She still has ongoing pain.  No vaginal symptoms.      Past Medical History:  Diagnosis Date   GERD (gastroesophageal reflux disease)    High cholesterol    Hypertension    Thyroid disease     Patient Active Problem List   Diagnosis Date Noted   Atrophy of vagina 02/19/2021   Constipation 02/19/2021   Gastroesophageal reflux disease 02/19/2021   Irritable bowel syndrome 02/19/2021   Mass of uterine adnexa 02/19/2021   Morbid obesity (HCC) 02/19/2021   Osteoarthritis 02/19/2021   Panic disorder 02/19/2021   Personal history of transient ischemic attack (TIA), and cerebral infarction without residual deficits 02/19/2021   Prediabetes 02/19/2021   Pure hypercholesterolemia 02/19/2021   Tension-type headache, unspecified, not intractable 02/19/2021   TIA (transient ischemic attack) 12/22/2017   Essential hypertension 12/22/2017   Hypothyroidism 12/22/2017   Weakness 12/21/2017   Acute cystitis without hematuria    ACUTE PANCREATITIS 08/23/2009    Past Surgical History:  Procedure Laterality Date   BREAST REDUCTION SURGERY     REDUCTION MAMMAPLASTY Bilateral     in the 80's   TUBAL LIGATION       OB History   No obstetric history on file.     Family History  Problem Relation Age of Onset   Stroke Mother    Diabetes Mother    Hypertension Mother    Diabetes Father    Hypertension Father    Heart attack Father     Social History   Tobacco Use   Smoking status: Former   Smokeless tobacco: Never  Building services engineer Use: Never used  Substance Use Topics   Alcohol use: No   Drug use: No    Home Medications Prior to Admission medications   Medication Sig Start Date End Date Taking? Authorizing Provider  amLODipine-benazepril (LOTREL) 5-10 MG per capsule Take 1 capsule by mouth daily.    [provider]  atenolol (TENORMIN) 25 MG tablet Take 50 mg by mouth daily.     [provider]  baclofen (LIORESAL) 20 MG tablet SMARTSIG:1 Tablet(s) By Mouth Every 8-12 Hours PRN 02/21/20   [provider]  Calcium Carb-Cholecalciferol 600-200 MG-UNIT TABS 1 tablet with food    [provider]  calcium-vitamin D (OSCAL WITH D) 500-200 MG-UNIT per tablet Take 1 tablet by mouth daily.    [provider]  clopidogrel (PLAVIX) 75 MG tablet Take 1 tablet (75 mg total) by mouth daily. 12/22/17   Rai, Delene Ruffini, MD  conjugated estrogens (PREMARIN) vaginal cream See admin  instructions. 10/17/20   [provider]  COVID-19 mRNA Vac-TriS, Pfizer, (PFIZER-BIONT COVID-19 VAC-TRIS) SUSP injection Inject into the muscle. 06/20/21   Carlyle Basques, MD  cyclobenzaprine (FLEXERIL) 10 MG tablet Take 10 mg by mouth every 8 (eight) hours as needed. 01/27/20   [provider]  dicyclomine (BENTYL) 20 MG tablet 1 Tablet orally up to every 6 hours  as needed for abdominal cramps    [provider]  fluconazole (DIFLUCAN) 150 MG tablet 1 TABLET ORALLY ONE DOSE, CAN REPEAT IN 3 DAYS 1 DAY 10/08/20   [provider]  fluticasone (FLONASE ALLERGY RELIEF) 50 MCG/ACT nasal spray 1 spray in each nostril  05/20/18   [provider]  ibuprofen (ADVIL) 800 MG tablet TAKE 1 TABLET BY MOUTH EVERY 8 HOURS WITH FOOD OR MILK AS NEEDED 30 DAYS 12/10/20   [provider]  levothyroxine (SYNTHROID, LEVOTHROID) 50 MCG tablet Take 50 mcg by mouth at bedtime. 10/11/17   [provider]  meclizine (ANTIVERT) 12.5 MG tablet Take 1 tablet (12.5 mg total) by mouth 3 (three) times daily as needed for dizziness. 12/29/19   Long, Wonda Olds, MD  meloxicam (MOBIC) 15 MG tablet 1 tablet    [provider]  methocarbamol (ROBAXIN) 500 MG tablet Take 500 mg by mouth every 6 (six) hours as needed. 02/24/20   [provider]  metroNIDAZOLE (FLAGYL) 500 MG tablet Take 1 tablet (500 mg total) by mouth 2 (two) times daily. One po bid x 7 days 10/29/21   Malvin Johns, MD  omeprazole (PRILOSEC) 20 MG capsule Take 20 mg by mouth daily.    [provider]  simvastatin (ZOCOR) 80 MG tablet Take 1 tablet (80 mg total) by mouth daily. 03/14/19   Frann Rider, NP  topiramate (TOPAMAX) 50 MG tablet Take 0.5 tablets (25 mg total) by mouth 2 (two) times daily. Start 1 tablet daily x 1 week and then twice daily 07/12/20   Frann Rider, NP    Allergies    Adalat [nifedipine]  Review of Systems   Review of Systems  Constitutional:  Positive for appetite change. Negative for chills, diaphoresis, fatigue and fever.  HENT:  Negative for congestion, rhinorrhea and sneezing.   Eyes: Negative.   Respiratory:  Negative for cough, chest tightness and shortness of breath.   Cardiovascular:  Negative for chest pain and leg swelling.  Gastrointestinal:  Positive for abdominal pain and nausea. Negative for blood in stool, diarrhea and vomiting.  Genitourinary:  Negative for difficulty urinating, flank pain, frequency and hematuria.  Musculoskeletal:  Negative for arthralgias and back pain.  Skin:  Negative for rash.  Neurological:  Negative for dizziness, speech difficulty, weakness, numbness  and headaches.   Physical Exam Updated Vital Signs BP (!) 155/77   Pulse (!) 51   Temp 98.8 F (37.1 C) (Oral)   Resp 18   Ht 5\' 5"  (1.651 m)   Wt 115.7 kg   SpO2 100%   BMI 42.43 kg/m   Physical Exam Constitutional:      Appearance: She is well-developed.  HENT:     Head: Normocephalic and atraumatic.  Eyes:     Pupils: Pupils are equal, round, and reactive to light.  Cardiovascular:     Rate and Rhythm: Normal rate and regular rhythm.     Heart sounds: Normal heart sounds.  Pulmonary:     Effort: Pulmonary effort is normal. No respiratory distress.     Breath sounds: Normal breath sounds. No wheezing  or rales.  Chest:     Chest wall: No tenderness.  Abdominal:     General: Bowel sounds are normal.     Palpations: Abdomen is soft.     Tenderness: There is abdominal tenderness in the right lower quadrant, suprapubic area and left lower quadrant. There is no guarding or rebound.  Genitourinary:    Comments: Moderate amount of white discharge, no cervical motion tenderness, no adnexal tenderness Musculoskeletal:        General: Normal range of motion.     Cervical back: Normal range of motion and neck supple.  Lymphadenopathy:     Cervical: No cervical adenopathy.  Skin:    General: Skin is warm and dry.     Findings: No rash.  Neurological:     Mental Status: She is alert and oriented to person, place, and time.    ED Results / Procedures / Treatments   Labs (all labs ordered are listed, but only abnormal results are displayed) Labs Reviewed  WET PREP, GENITAL - Abnormal; Notable for the following components:      Result Value   Clue Cells Wet Prep HPF POC PRESENT (*)    WBC, Wet Prep HPF POC >=10 (*)    All other components within normal limits  URINALYSIS, ROUTINE W REFLEX MICROSCOPIC - Abnormal; Notable for the following components:   Hgb urine dipstick SMALL (*)    Leukocytes,Ua TRACE (*)    All other components within normal limits  URINALYSIS,  MICROSCOPIC (REFLEX) - Abnormal; Notable for the following components:   Bacteria, UA RARE (*)    All other components within normal limits  COMPREHENSIVE METABOLIC PANEL  LIPASE, BLOOD  CBC WITH DIFFERENTIAL/PLATELET  GC/CHLAMYDIA PROBE AMP ([place]) NOT AT [facility]    EKG None  Radiology CT Abdomen Pelvis W Contrast  Result Date: 10/29/2021 CLINICAL DATA:  Abdominal pain. EXAM: CT ABDOMEN AND PELVIS WITH CONTRAST TECHNIQUE: Multidetector CT imaging of the abdomen and pelvis was performed using the standard protocol following bolus administration of intravenous contrast. CONTRAST:  115mL OMNIPAQUE IOHEXOL 300 MG/ML  SOLN COMPARISON:  December 21, 2017 FINDINGS: Lower chest: No acute abnormality. Hepatobiliary: A 1.5 cm well-defined area of low attenuation is again seen within the anterolateral aspect of the right lobe of the liver. No gallstones, gallbladder wall thickening, or biliary dilatation. Pancreas: Unremarkable. No pancreatic ductal dilatation or surrounding inflammatory changes. Spleen: Punctate calcified granulomas are seen within the spleen. Adrenals/Urinary Tract: The left adrenal gland is normal in appearance. An 8 mm hyperdense area of nodularity (approximately 101.00 Hounsfield units) is seen within the right adrenal gland (axial CT image 17, CT series 2/coronal reformatted image 44 CT series 5). This is new when compared to the prior study. Kidneys are normal, without renal calculi, focal lesion, or hydronephrosis. Bladder is unremarkable. Stomach/Bowel: Stomach is within normal limits. Appendix appears normal. No evidence of bowel wall thickening, distention, or inflammatory changes. Vascular/Lymphatic: Aortic atherosclerosis. No enlarged abdominal or pelvic lymph nodes. Reproductive: Uterus and bilateral adnexa are unremarkable. Other: No abdominal wall hernia or abnormality. No abdominopelvic ascites. Musculoskeletal: No acute or significant osseous findings. IMPRESSION: 1. Stable  hepatic cyst. 2. 8 mm hyperdense nodularity within the right adrenal gland which may represent an adrenal adenoma. Given the size of the region of interest and short axis (less than 1 cm), no further evaluation is recommended. This recommendation follows ACR consensus guidelines: Management of Incidental Adrenal Masses: A White Paper of the ACR Incidental Findings Committee. J Am  Coll Radiol 2017;14:1038-1044. 3. Aortic atherosclerosis. Aortic Atherosclerosis (ICD10-I70.0). Electronically Signed   By: Virgina Norfolk M.D.   On: 10/29/2021 19:38    Procedures Procedures   Medications Ordered in ED Medications  iohexol (OMNIPAQUE) 300 MG/ML solution 100 mL (100 mLs Intravenous Contrast Given 10/29/21 1912)    ED Course  I have reviewed the triage vital signs and the nursing notes.  Pertinent labs & imaging results that were available during my care of the patient were reviewed by me and considered in my medical decision making (see chart for details).    MDM Rules/Calculators/A&P                           Patient is a 60 year old female who presents with lower abdominal pain.  She also had a bit of a decreased appetite.  Her labs are nonconcerning.  Her urine is not consistent with infection.  She is postmenopausal so pregnancy test was not done.  She had a CT scan to assess for diverticulitis or other acute abnormality.  This was negative for acute abnormality.  There was some atherosclerosis which I did inform the patient about.  She has an upcoming appointment with her PCP.  Given that her pain was lower in her abdomen, pelvic exam was performed which showed some discharge.  This is positive for BV.  She was started on Flagyl.  She was encouraged to follow-up with her PCP.  Return precautions were given. Final Clinical Impression(s) / ED Diagnoses Final diagnoses:  Lower abdominal pain  Bacterial vaginosis    Rx / DC Orders ED Discharge Orders          Ordered    metroNIDAZOLE  (FLAGYL) 500 MG tablet  2 times daily,   Status:  Discontinued        10/29/21 2108    metroNIDAZOLE (FLAGYL) 500 MG tablet  2 times daily        10/29/21 2109             Malvin Johns, MD 10/29/21 2111

## 2021-10-30 LAB — GC/CHLAMYDIA PROBE AMP (~~LOC~~) NOT AT ARMC
Chlamydia: NEGATIVE
Comment: NEGATIVE
Comment: NORMAL
Neisseria Gonorrhea: NEGATIVE

## 2022-04-04 ENCOUNTER — Other Ambulatory Visit (HOSPITAL_COMMUNITY)
Admission: RE | Admit: 2022-04-04 | Discharge: 2022-04-04 | Disposition: A | Discharge status: Home / Self Care | Payer: No Typology Code available for payment source | Source: Ambulatory Visit | Attending: Obstetrics and Gynecology | Admitting: Obstetrics and Gynecology

## 2022-04-04 DIAGNOSIS — Z01419 Encounter for gynecological examination (general) (routine) without abnormal findings: Secondary | ICD-10-CM | POA: Diagnosis present

## 2022-04-07 ENCOUNTER — Other Ambulatory Visit: Payer: Self-pay | Admitting: Obstetrics and Gynecology

## 2022-04-07 DIAGNOSIS — M8580 Other specified disorders of bone density and structure, unspecified site: Secondary | ICD-10-CM

## 2022-04-10 LAB — CYTOLOGY - PAP
Comment: NEGATIVE
Diagnosis: NEGATIVE
Diagnosis: REACTIVE
High risk HPV: NEGATIVE

## 2022-06-05 ENCOUNTER — Ambulatory Visit
Admission: RE | Admit: 2022-06-05 | Discharge: 2022-06-05 | Disposition: A | Discharge status: Home / Self Care | Payer: No Typology Code available for payment source | Source: Ambulatory Visit | Attending: Obstetrics and Gynecology | Admitting: Obstetrics and Gynecology

## 2022-06-05 DIAGNOSIS — M8580 Other specified disorders of bone density and structure, unspecified site: Secondary | ICD-10-CM

## 2022-09-09 ENCOUNTER — Encounter (HOSPITAL_BASED_OUTPATIENT_CLINIC_OR_DEPARTMENT_OTHER): Payer: Self-pay

## 2022-09-09 ENCOUNTER — Emergency Department (HOSPITAL_BASED_OUTPATIENT_CLINIC_OR_DEPARTMENT_OTHER)
Admission: EM | Admit: 2022-09-09 | Discharge: 2022-09-09 | Disposition: A | Discharge status: Home / Self Care | Payer: No Typology Code available for payment source | Attending: Emergency Medicine | Admitting: Emergency Medicine

## 2022-09-09 ENCOUNTER — Emergency Department (HOSPITAL_BASED_OUTPATIENT_CLINIC_OR_DEPARTMENT_OTHER): Payer: No Typology Code available for payment source

## 2022-09-09 ENCOUNTER — Other Ambulatory Visit: Payer: Self-pay

## 2022-09-09 DIAGNOSIS — K5900 Constipation, unspecified: Secondary | ICD-10-CM | POA: Insufficient documentation

## 2022-09-09 DIAGNOSIS — R5383 Other fatigue: Secondary | ICD-10-CM | POA: Diagnosis not present

## 2022-09-09 DIAGNOSIS — R110 Nausea: Secondary | ICD-10-CM | POA: Insufficient documentation

## 2022-09-09 DIAGNOSIS — R1013 Epigastric pain: Secondary | ICD-10-CM | POA: Diagnosis present

## 2022-09-09 DIAGNOSIS — Z7902 Long term (current) use of antithrombotics/antiplatelets: Secondary | ICD-10-CM | POA: Insufficient documentation

## 2022-09-09 DIAGNOSIS — Z79899 Other long term (current) drug therapy: Secondary | ICD-10-CM | POA: Insufficient documentation

## 2022-09-09 DIAGNOSIS — I10 Essential (primary) hypertension: Secondary | ICD-10-CM | POA: Diagnosis not present

## 2022-09-09 LAB — TROPONIN I (HIGH SENSITIVITY): Troponin I (High Sensitivity): 3 ng/L (ref <18)

## 2022-09-09 LAB — URINALYSIS, ROUTINE W REFLEX MICROSCOPIC
Bilirubin Urine: NEGATIVE
Glucose, UA: NEGATIVE mg/dL
Ketones, ur: NEGATIVE mg/dL
Nitrite: NEGATIVE
Protein, ur: NEGATIVE mg/dL
Specific Gravity, Urine: 1.01 (ref 1.005–1.030)
pH: 6.5 (ref 5.0–8.0)

## 2022-09-09 LAB — LIPASE, BLOOD: Lipase: 43 U/L (ref 11–51)

## 2022-09-09 LAB — COMPREHENSIVE METABOLIC PANEL
ALT: 18 U/L (ref 0–44)
AST: 17 U/L (ref 15–41)
Albumin: 4.3 g/dL (ref 3.5–5.0)
Alkaline Phosphatase: 88 U/L (ref 38–126)
Anion gap: 9 (ref 5–15)
BUN: 9 mg/dL (ref 8–23)
CO2: 28 mmol/L (ref 22–32)
Calcium: 9.8 mg/dL (ref 8.9–10.3)
Chloride: 105 mmol/L (ref 98–111)
Creatinine, Ser: 0.92 mg/dL (ref 0.44–1.00)
GFR, Estimated: >60 mL/min (ref >60)
Glucose, Bld: 90 mg/dL (ref 70–99)
Potassium: 4 mmol/L (ref 3.5–5.1)
Sodium: 142 mmol/L (ref 135–145)
Total Bilirubin: 0.7 mg/dL (ref 0.3–1.2)
Total Protein: 7.5 g/dL (ref 6.5–8.1)

## 2022-09-09 LAB — CBC
HCT: 44.7 % (ref 36.0–46.0)
Hemoglobin: 14.4 g/dL (ref 12.0–15.0)
MCH: 31.8 pg (ref 26.0–34.0)
MCHC: 32.2 g/dL (ref 30.0–36.0)
MCV: 98.7 fL (ref 80.0–100.0)
Platelets: 244 10*3/uL (ref 150–400)
RBC: 4.53 MIL/uL (ref 3.87–5.11)
RDW: 13 % (ref 11.5–15.5)
WBC: 5.2 10*3/uL (ref 4.0–10.5)
nRBC: 0 % (ref 0.0–0.2)

## 2022-09-09 MED ORDER — SENNA 8.6 MG PO TABS: 1.0000 | ORAL_TABLET | Freq: Every day | ORAL | 0 refills | Status: AC

## 2022-09-09 MED ORDER — POLYETHYLENE GLYCOL 3350 17 G PO PACK: 17.0000 g | PACK | Freq: Every day | ORAL | 0 refills | Status: AC

## 2022-09-09 MED ORDER — IOHEXOL 300 MG/ML  SOLN
100.0000 mL | Freq: Once | INTRAMUSCULAR | Status: AC | PRN
  Administered 2022-09-09: 100 mL via INTRAVENOUS

## 2022-09-09 NOTE — Discharge Instructions (Addendum)
Please take 1 packet of Miralax daily. If no bowel movement in 3 days, you can increase to 2 packets of Miralax. Please take Senokot one tablet daily.   You may also pick up at the pharmacy a bottle of milk of magnesia.

## 2022-09-09 NOTE — ED Provider Notes (Signed)
[facility] EMERGENCY DEPT Provider Note   CSN: [id number] Arrival date & time: 09/09/22  1247     History Chief Complaint  Patient presents with   Abdominal Pain    Kimberly Watts is a 61 y.o. female.  The history is provided by the patient.  Abdominal Pain Associated symptoms: constipation, fatigue and nausea   Associated symptoms: no chest pain, no chills, no dysuria, no fever, no vaginal discharge and no vomiting     Patient with history of GERD, HTN presents with diffuse abdominal pain for past 1 week. She describes the pain as sharp at times and intermittent. She notes pain starts mostly epigastric region that spreads to suprapubic region. Pain also radiates to the lower back. History of constipation. Last bowel movement was 2-3 days ago. She tried stool softeners the last 2 days with little stool produced. Pain is 8/10. Denies vomiting, chest pain, fever, chills. Recently taking opioids for back pain.   Home Medications Prior to Admission medications   Medication Sig Start Date End Date Taking? Authorizing Provider  polyethylene glycol (MIRALAX / GLYCOLAX) 17 g packet Take 17 g by mouth daily. 09/09/22  Yes Rana Snare, DO  senna (SENOKOT) 8.6 MG TABS tablet Take 1 tablet (8.6 mg total) by mouth daily. 09/09/22  Yes Rana Snare, DO  amLODipine-benazepril (LOTREL) 5-10 MG per capsule Take 1 capsule by mouth daily.    [provider]  atenolol (TENORMIN) 25 MG tablet Take 50 mg by mouth daily.     [provider]  baclofen (LIORESAL) 20 MG tablet SMARTSIG:1 Tablet(s) By Mouth Every 8-12 Hours PRN 02/21/20   [provider]  Calcium Carb-Cholecalciferol 600-200 MG-UNIT TABS 1 tablet with food    [provider]  calcium-vitamin D (OSCAL WITH D) 500-200 MG-UNIT per tablet Take 1 tablet by mouth daily.    [provider]  clopidogrel (PLAVIX) 75 MG tablet Take 1 tablet (75 mg total) by mouth daily. 12/22/17   Rai,  Delene Ruffini, MD  conjugated estrogens (PREMARIN) vaginal cream See admin instructions. 10/17/20   [provider]  COVID-19 mRNA Vac-TriS, Pfizer, (PFIZER-BIONT COVID-19 VAC-TRIS) SUSP injection Inject into the muscle. 06/20/21   Judyann Munson, MD  cyclobenzaprine (FLEXERIL) 10 MG tablet Take 10 mg by mouth every 8 (eight) hours as needed. 01/27/20   [provider]  dicyclomine (BENTYL) 20 MG tablet 1 Tablet orally up to every 6 hours  as needed for abdominal cramps    [provider]  fluconazole (DIFLUCAN) 150 MG tablet 1 TABLET ORALLY ONE DOSE, CAN REPEAT IN 3 DAYS 1 DAY 10/08/20   [provider]  fluticasone (FLONASE ALLERGY RELIEF) 50 MCG/ACT nasal spray 1 spray in each nostril 05/20/18   [provider]  ibuprofen (ADVIL) 800 MG tablet TAKE 1 TABLET BY MOUTH EVERY 8 HOURS WITH FOOD OR MILK AS NEEDED 30 DAYS 12/10/20   [provider]  levothyroxine (SYNTHROID, LEVOTHROID) 50 MCG tablet Take 50 mcg by mouth at bedtime. 10/11/17   [provider]  meclizine (ANTIVERT) 12.5 MG tablet Take 1 tablet (12.5 mg total) by mouth 3 (three) times daily as needed for dizziness. 12/29/19   Long, Arlyss Repress, MD  meloxicam (MOBIC) 15 MG tablet 1 tablet    [provider]  methocarbamol (ROBAXIN) 500 MG tablet Take 500 mg by mouth every 6 (six) hours as needed. 02/24/20   [provider]  metroNIDAZOLE (FLAGYL) 500 MG tablet Take 1 tablet (500 mg total) by  mouth 2 (two) times daily. One po bid x 7 days 10/29/21   Rolan Bucco, MD  omeprazole (PRILOSEC) 20 MG capsule Take 20 mg by mouth daily.    [provider]  simvastatin (ZOCOR) 80 MG tablet Take 1 tablet (80 mg total) by mouth daily. 03/14/19   Ihor Austin, NP  topiramate (TOPAMAX) 50 MG tablet Take 0.5 tablets (25 mg total) by mouth 2 (two) times daily. Start 1 tablet daily x 1 week and then twice daily 07/12/20   Ihor Austin, NP      Allergies    Adalat  [nifedipine]    Review of Systems   Review of Systems  Constitutional:  Positive for fatigue. Negative for chills and fever.  Cardiovascular:  Negative for chest pain.  Gastrointestinal:  Positive for abdominal pain, constipation and nausea. Negative for blood in stool and vomiting.  Genitourinary:  Negative for dysuria and vaginal discharge.  Musculoskeletal:  Positive for back pain.    Physical Exam Updated Vital Signs BP (!) 167/88   Pulse (!) 53   Temp 98.4 F (36.9 C)   Resp 18   Ht 5\' 5"  (1.651 m)   Wt 115.7 kg   SpO2 100%   BMI 42.45 kg/m  Physical Exam Constitutional:      General: She is not in acute distress.    Appearance: She is obese. She is not ill-appearing.  HENT:     Head: Normocephalic and atraumatic.  Cardiovascular:     Rate and Rhythm: Normal rate and regular rhythm.  Pulmonary:     Effort: Pulmonary effort is normal.  Abdominal:     General: Bowel sounds are normal.     Palpations: Abdomen is soft.     Tenderness: There is generalized abdominal tenderness. There is no guarding or rebound.  Skin:    General: Skin is warm and dry.  Neurological:     Mental Status: She is alert.     ED Results / Procedures / Treatments   Labs (all labs ordered are listed, but only abnormal results are displayed) Labs Reviewed  URINALYSIS, ROUTINE W REFLEX MICROSCOPIC - Abnormal; Notable for the following components:      Result Value   Hgb urine dipstick TRACE (*)    Leukocytes,Ua TRACE (*)    All other components within normal limits  LIPASE, BLOOD  COMPREHENSIVE METABOLIC PANEL  CBC  TROPONIN I (HIGH SENSITIVITY)  TROPONIN I (HIGH SENSITIVITY)    EKG EKG Interpretation  Date/Time:  Tuesday September 09 2022 16:39:11 EDT Ventricular Rate:  50 PR Interval:  224 QRS Duration: 132 QT Interval:  453 QTC Calculation: 414 R Axis:   19 Text Interpretation: Sinus rhythm Prolonged PR interval Nonspecific intraventricular conduction delay Confirmed by  [id number] ([id number]) on 09/09/2022 4:50:28 PM  Radiology CT ABDOMEN PELVIS W CONTRAST  Result Date: 09/09/2022 CLINICAL DATA:  Lower abdominal pain x1 week. EXAM: CT ABDOMEN AND PELVIS WITH CONTRAST TECHNIQUE: Multidetector CT imaging of the abdomen and pelvis was performed using the standard protocol following bolus administration of intravenous contrast. RADIATION DOSE REDUCTION: This exam was performed according to the departmental dose-optimization program which includes automated exposure control, adjustment of the mA and/or kV according to patient size and/or use of iterative reconstruction technique. CONTRAST:  09/11/2022 OMNIPAQUE IOHEXOL 300 MG/ML  SOLN COMPARISON:  October 29, 2021 FINDINGS: Lower chest: No acute abnormality. Hepatobiliary: A predominantly stable 17 mm x 13 mm well-defined focus of parenchymal low attenuation is seen within the anterolateral aspect  of the right lobe of the liver. No gallstones, gallbladder wall thickening, or biliary dilatation. Pancreas: Unremarkable. No pancreatic ductal dilatation or surrounding inflammatory changes. Spleen: Normal in size without focal abnormality. Adrenals/Urinary Tract: Adrenal glands are unremarkable. Kidneys are normal, without renal calculi, focal lesion, or hydronephrosis. Bladder is unremarkable. Stomach/Bowel: Stomach is within normal limits. Appendix appears normal. No evidence of bowel wall thickening, distention, or inflammatory changes. Vascular/Lymphatic: Aortic atherosclerosis. No enlarged abdominal or pelvic lymph nodes. Reproductive: Uterus and bilateral adnexa are unremarkable. An 18 mm x 8 mm labial cyst is seen on the right (axial CT image 95, CT series 3). Other: No abdominal wall hernia or abnormality. No abdominopelvic ascites. Musculoskeletal: No acute or significant osseous findings. IMPRESSION: 1. Small hepatic cyst versus hemangioma. Correlation with nonemergent hepatic ultrasound is recommended. 2. Aortic atherosclerosis.  Aortic Atherosclerosis (ICD10-I70.0). Electronically Signed   By: Virgina Norfolk M.D.   On: 09/09/2022 17:14    Procedures Procedures    Medications Ordered in ED Medications  iohexol (OMNIPAQUE) 300 MG/ML solution 100 mL (100 mLs Intravenous Contrast Given 09/09/22 1655)    ED Course/ Medical Decision Making/ A&P                           Medical Decision Making Amount and/or Complexity of Data Reviewed Labs: ordered. Radiology: ordered.  Risk OTC drugs. Prescription drug management.  Patient presents with diffuse abdominal pain. Pain starts in epigastric region then radiates diffusely and into lower back. Recently taking opioid for back pain. Differentials include but are not limited to constipation, SBO, ACS. EKG was reassuring. CT A/P showed large amounts of stool load, negative for SBO. Labs unremarkable. Likely abdominal pain is from constipation. Will discharge with Miralax and senna daily. She was encouraged to follow up with her PCP.    Final Clinical Impression(s) / ED Diagnoses Final diagnoses:  Constipation, unspecified constipation type    Rx / DC Orders ED Discharge Orders          Ordered    polyethylene glycol (MIRALAX / GLYCOLAX) 17 g packet  Daily        09/09/22 1744    senna (SENOKOT) 8.6 MG TABS tablet  Daily        09/09/22 1744              Angelique Blonder, DO 46/27/03 [id number]    Gray, Kirkwood P, DO 38/18/29 [phone]

## 2022-09-09 NOTE — ED Triage Notes (Signed)
Patient here POV from Home.  Endorses Lower Bilateral ABD Pain for approximately 1 Week that radiates around to Lower Bilateral Back as well. Worsening since.  Some Constipation. Some Nausea. No Emesis. No Known Fevers.  NAD Noted during Triage. A&Ox4. GCS 15. Ambulatory.

## 2022-10-30 ENCOUNTER — Other Ambulatory Visit: Payer: Self-pay | Admitting: Family Medicine

## 2022-10-30 DIAGNOSIS — Z1231 Encounter for screening mammogram for malignant neoplasm of breast: Secondary | ICD-10-CM

## 2022-12-25 ENCOUNTER — Ambulatory Visit
Admission: RE | Admit: 2022-12-25 | Discharge: 2022-12-25 | Disposition: A | Discharge status: Home / Self Care | Payer: No Typology Code available for payment source | Source: Ambulatory Visit | Attending: Family Medicine | Admitting: Family Medicine

## 2022-12-25 DIAGNOSIS — Z1231 Encounter for screening mammogram for malignant neoplasm of breast: Secondary | ICD-10-CM

## 2023-05-22 ENCOUNTER — Encounter: Payer: Self-pay | Admitting: Podiatry

## 2023-05-22 ENCOUNTER — Ambulatory Visit (INDEPENDENT_AMBULATORY_CARE_PROVIDER_SITE_OTHER): Payer: No Typology Code available for payment source

## 2023-05-22 ENCOUNTER — Ambulatory Visit (INDEPENDENT_AMBULATORY_CARE_PROVIDER_SITE_OTHER): Payer: No Typology Code available for payment source | Admitting: Podiatry

## 2023-05-22 DIAGNOSIS — M722 Plantar fascial fibromatosis: Secondary | ICD-10-CM

## 2023-05-22 MED ORDER — DICLOFENAC SODIUM 75 MG PO TBEC: 75.0000 mg | DELAYED_RELEASE_TABLET | Freq: Two times a day (BID) | ORAL | 2 refills | Status: AC

## 2023-05-22 MED ORDER — TRIAMCINOLONE ACETONIDE 10 MG/ML IJ SUSP
20.0000 mg | Freq: Once | INTRAMUSCULAR | Status: AC
  Administered 2023-05-22: 20 mg

## 2023-05-22 NOTE — Patient Instructions (Signed)

## 2023-05-24 NOTE — Progress Notes (Signed)
Subjective:   Patient ID: Kimberly Watts, female   DOB: 62 y.o.   MRN: [id number]   HPI Patient presents stating that my heels have really been bothering me and they just started in the last approximate month.  Hard to walk on   ROS      Objective:  Physical Exam  Neurovascular status intact discomfort in the plantar heel region bilateral fluid buildup around the medial band     Assessment:  Acute Planter fasciitis right with inflammation and left     Plan:  H&P reviewed and went ahead did sterile prep injected the fascia bilateral 3 mg Kenalog 5 mg Xylocaine gave instructions patient to be seen back  X-rays indicate small spur formation bilateral no indication of stress fracture arthritis

## 2024-01-16 ENCOUNTER — Emergency Department (HOSPITAL_BASED_OUTPATIENT_CLINIC_OR_DEPARTMENT_OTHER)
Admission: EM | Admit: 2024-01-16 | Discharge: 2024-01-16 | Disposition: A | Discharge status: Home / Self Care | Payer: No Typology Code available for payment source | Attending: Emergency Medicine | Admitting: Emergency Medicine

## 2024-01-16 ENCOUNTER — Encounter (HOSPITAL_BASED_OUTPATIENT_CLINIC_OR_DEPARTMENT_OTHER): Payer: Self-pay | Admitting: Emergency Medicine

## 2024-01-16 ENCOUNTER — Emergency Department (HOSPITAL_BASED_OUTPATIENT_CLINIC_OR_DEPARTMENT_OTHER): Payer: No Typology Code available for payment source

## 2024-01-16 DIAGNOSIS — R519 Headache, unspecified: Secondary | ICD-10-CM | POA: Diagnosis present

## 2024-01-16 DIAGNOSIS — R42 Dizziness and giddiness: Secondary | ICD-10-CM | POA: Insufficient documentation

## 2024-01-16 DIAGNOSIS — Z79899 Other long term (current) drug therapy: Secondary | ICD-10-CM | POA: Insufficient documentation

## 2024-01-16 DIAGNOSIS — I10 Essential (primary) hypertension: Secondary | ICD-10-CM | POA: Diagnosis not present

## 2024-01-16 DIAGNOSIS — R200 Anesthesia of skin: Secondary | ICD-10-CM | POA: Insufficient documentation

## 2024-01-16 LAB — CBC WITH DIFFERENTIAL/PLATELET
Abs Immature Granulocytes: 0.01 10*3/uL (ref 0.00–0.07)
Basophils Absolute: 0.1 10*3/uL (ref 0.0–0.1)
Basophils Relative: 1 %
Eosinophils Absolute: 0.1 10*3/uL (ref 0.0–0.5)
Eosinophils Relative: 2 %
HCT: 43 % (ref 36.0–46.0)
Hemoglobin: 13.8 g/dL (ref 12.0–15.0)
Immature Granulocytes: 0 %
Lymphocytes Relative: 43 %
Lymphs Abs: 2.2 10*3/uL (ref 0.7–4.0)
MCH: 31.2 pg (ref 26.0–34.0)
MCHC: 32.1 g/dL (ref 30.0–36.0)
MCV: 97.3 fL (ref 80.0–100.0)
Monocytes Absolute: 0.5 10*3/uL (ref 0.1–1.0)
Monocytes Relative: 10 %
Neutro Abs: 2.3 10*3/uL (ref 1.7–7.7)
Neutrophils Relative %: 44 %
Platelets: 233 10*3/uL (ref 150–400)
RBC: 4.42 MIL/uL (ref 3.87–5.11)
RDW: 12.6 % (ref 11.5–15.5)
WBC: 5.2 10*3/uL (ref 4.0–10.5)
nRBC: 0 % (ref 0.0–0.2)

## 2024-01-16 LAB — BASIC METABOLIC PANEL
Anion gap: 8 (ref 5–15)
BUN: 11 mg/dL (ref 8–23)
CO2: 26 mmol/L (ref 22–32)
Calcium: 9 mg/dL (ref 8.9–10.3)
Chloride: 104 mmol/L (ref 98–111)
Creatinine, Ser: 1.02 mg/dL — ABNORMAL HIGH (ref 0.44–1.00)
GFR, Estimated: >60 mL/min (ref >60)
Glucose, Bld: 106 mg/dL — ABNORMAL HIGH (ref 70–99)
Potassium: 3.8 mmol/L (ref 3.5–5.1)
Sodium: 138 mmol/L (ref 135–145)

## 2024-01-16 MED ORDER — KETOROLAC TROMETHAMINE 30 MG/ML IJ SOLN
30.0000 mg | Freq: Once | INTRAMUSCULAR | Status: AC
  Administered 2024-01-16: 30 mg via INTRAVENOUS
  Filled 2024-01-16: qty 1

## 2024-01-16 MED ORDER — SODIUM CHLORIDE 0.9 % IV BOLUS
1000.0000 mL | Freq: Once | INTRAVENOUS | Status: AC
  Administered 2024-01-16: 1000 mL via INTRAVENOUS

## 2024-01-16 MED ORDER — METOCLOPRAMIDE HCL 5 MG/ML IJ SOLN
10.0000 mg | Freq: Once | INTRAMUSCULAR | Status: AC
  Administered 2024-01-16: 10 mg via INTRAVENOUS
  Filled 2024-01-16: qty 2

## 2024-01-16 MED ORDER — DIPHENHYDRAMINE HCL 50 MG/ML IJ SOLN
25.0000 mg | Freq: Once | INTRAMUSCULAR | Status: AC
  Administered 2024-01-16: 25 mg via INTRAVENOUS
  Filled 2024-01-16: qty 1

## 2024-01-16 MED ORDER — DEXAMETHASONE SODIUM PHOSPHATE 10 MG/ML IJ SOLN
10.0000 mg | Freq: Once | INTRAMUSCULAR | Status: AC
  Administered 2024-01-16: 10 mg via INTRAVENOUS
  Filled 2024-01-16: qty 1

## 2024-01-16 NOTE — Discharge Instructions (Signed)
Continue medications as previously prescribed.  Follow-up with primary doctor if symptoms persist, and return to the ER if symptoms significantly worsen or change.

## 2024-01-16 NOTE — ED Triage Notes (Signed)
Started dizziness on Monday, tingling and numbness two days ago. Also has headache in back of head.

## 2024-01-16 NOTE — ED Provider Notes (Signed)
[place] [facility] Provider Note   CSN: [id number] Arrival date & time: 01/16/24  0353     History  Chief Complaint  Patient presents with   Numbness    Kimberly Watts is a 63 y.o. female.  Patient is a 63 year old female with past medical history of hypertension.  Patient presenting today with complaints of headache and numbness.  She started this past Monday having intermittent headaches, then 2 days ago began to notice numbness in her arms and legs.  She has felt dizzy intermittently over the past few days as well.  She is able to ambulate and denies any bowel or bladder issues.  No fevers or chills.  The history is provided by the patient.       Home Medications Prior to Admission medications   Medication Sig Start Date End Date Taking? Authorizing Provider  amLODipine-benazepril (LOTREL) 5-10 MG per capsule Take 1 capsule by mouth daily.    [provider]  atenolol (TENORMIN) 25 MG tablet Take 50 mg by mouth daily.     [provider]  baclofen (LIORESAL) 20 MG tablet SMARTSIG:1 Tablet(s) By Mouth Every 8-12 Hours PRN 02/21/20   [provider]  Calcium Carb-Cholecalciferol 600-200 MG-UNIT TABS 1 tablet with food    [provider]  calcium-vitamin D (OSCAL WITH D) 500-200 MG-UNIT per tablet Take 1 tablet by mouth daily.    [provider]  clopidogrel (PLAVIX) 75 MG tablet Take 1 tablet (75 mg total) by mouth daily. 12/22/17   Rai, Delene Ruffini, MD  conjugated estrogens (PREMARIN) vaginal cream See admin instructions. 10/17/20   [provider]  COVID-19 mRNA Vac-TriS, Pfizer, (PFIZER-BIONT COVID-19 VAC-TRIS) SUSP injection Inject into the muscle. 06/20/21   Judyann Munson, MD  cyclobenzaprine (FLEXERIL) 10 MG tablet Take 10 mg by mouth every 8 (eight) hours as needed. 01/27/20   [provider]  diclofenac (VOLTAREN) 75 MG EC tablet Take 1 tablet (75 mg total) by mouth 2 (two)  times daily. 05/22/23   Lenn Sink, DPM  dicyclomine (BENTYL) 20 MG tablet 1 Tablet orally up to every 6 hours  as needed for abdominal cramps    [provider]  fluconazole (DIFLUCAN) 150 MG tablet 1 TABLET ORALLY ONE DOSE, CAN REPEAT IN 3 DAYS 1 DAY 10/08/20   [provider]  fluticasone (FLONASE ALLERGY RELIEF) 50 MCG/ACT nasal spray 1 spray in each nostril 05/20/18   [provider]  ibuprofen (ADVIL) 800 MG tablet TAKE 1 TABLET BY MOUTH EVERY 8 HOURS WITH FOOD OR MILK AS NEEDED 30 DAYS 12/10/20   [provider]  levothyroxine (SYNTHROID, LEVOTHROID) 50 MCG tablet Take 50 mcg by mouth at bedtime. 10/11/17   [provider]  meclizine (ANTIVERT) 12.5 MG tablet Take 1 tablet (12.5 mg total) by mouth 3 (three) times daily as needed for dizziness. 12/29/19   Long, Arlyss Repress, MD  meloxicam (MOBIC) 15 MG tablet 1 tablet    [provider]  methocarbamol (ROBAXIN) 500 MG tablet Take 500 mg by mouth every 6 (six) hours as needed. 02/24/20   [provider]  metroNIDAZOLE (FLAGYL) 500 MG tablet Take 1 tablet (500 mg total) by mouth 2 (two) times daily. One po bid x 7 days 10/29/21   Rolan Bucco, MD  omeprazole (PRILOSEC) 20 MG capsule Take 20 mg by mouth daily.    [provider]  polyethylene glycol (MIRALAX / GLYCOLAX) 17 g packet Take 17 g  by mouth daily. 09/09/22   Rana Snare, DO  senna (SENOKOT) 8.6 MG TABS tablet Take 1 tablet (8.6 mg total) by mouth daily. 09/09/22   Rana Snare, DO  simvastatin (ZOCOR) 80 MG tablet Take 1 tablet (80 mg total) by mouth daily. 03/14/19   Ihor Austin, NP  topiramate (TOPAMAX) 50 MG tablet Take 0.5 tablets (25 mg total) by mouth 2 (two) times daily. Start 1 tablet daily x 1 week and then twice daily 07/12/20   Ihor Austin, NP      Allergies    Adalat [nifedipine]    Review of Systems   Review of Systems  All other systems reviewed and are negative.   Physical Exam Updated  Vital Signs BP (!) 146/81 (BP Location: Left Arm)   Pulse 74   Temp 98.1 F (36.7 C) (Oral)   Resp 18   Ht 5\' 5"  (1.651 m)   Wt 111.1 kg   SpO2 100%   BMI 40.77 kg/m  Physical Exam Vitals and nursing note reviewed.  Constitutional:      General: She is not in acute distress.    Appearance: She is well-developed. She is not diaphoretic.  HENT:     Head: Normocephalic and atraumatic.  Eyes:     Extraocular Movements: Extraocular movements intact.     Pupils: Pupils are equal, round, and reactive to light.  Cardiovascular:     Rate and Rhythm: Normal rate and regular rhythm.     Heart sounds: No murmur heard.    No friction rub. No gallop.  Pulmonary:     Effort: Pulmonary effort is normal. No respiratory distress.     Breath sounds: Normal breath sounds. No wheezing.  Abdominal:     General: Bowel sounds are normal. There is no distension.     Palpations: Abdomen is soft.     Tenderness: There is no abdominal tenderness.  Musculoskeletal:        General: Normal range of motion.     Cervical back: Normal range of motion and neck supple.  Skin:    General: Skin is warm and dry.  Neurological:     General: No focal deficit present.     Mental Status: She is alert and oriented to person, place, and time.     Cranial Nerves: No cranial nerve deficit.     Motor: No weakness.     ED Results / Procedures / Treatments   Labs (all labs ordered are listed, but only abnormal results are displayed) Labs Reviewed - No data to display  EKG EKG Interpretation Date/Time:  Saturday January 16 2024 04:17:19 EST Ventricular Rate:  54 PR Interval:  214 QRS Duration:  109 QT Interval:  449 QTC Calculation: 426 R Axis:   12  Text Interpretation: Sinus bradycardia Borderline prolonged PR interval Low voltage, precordial leads Confirmed by Geoffery Lyons ([id number]) on 01/16/2024 4:20:08 AM  Radiology No results found.  Procedures Procedures    Medications Ordered in  ED Medications  ketorolac (TORADOL) 30 MG/ML injection 30 mg (has no administration in time range)  metoCLOPramide (REGLAN) injection 10 mg (has no administration in time range)  sodium chloride 0.9 % bolus 1,000 mL (has no administration in time range)    ED Course/ Medical Decision Making/ A&P  Patient is a 63 year old female presenting with headache and numbness as described in the HPI.  Patient arrives with stable vital signs and is afebrile.  Physical examination is unremarkable and she is neurologically intact.  Laboratory  studies obtained including CBC and basic metabolic panel, both of which are basically unremarkable.  CT scan of the head shows no acute process.  Patient given a migraine cocktail consisting of Benadryl, Decadron, Toradol, and Reglan with good results.  She states she feels "a whole lot better".  I assume symptoms are migraine related and feel as though patient can safely be discharged.  Final Clinical Impression(s) / ED Diagnoses Final diagnoses:  None    Rx / DC Orders ED Discharge Orders     None         Geoffery Lyons, MD 01/16/24 [id number]

## 2024-02-03 ENCOUNTER — Other Ambulatory Visit: Payer: Self-pay | Admitting: Family Medicine

## 2024-02-03 DIAGNOSIS — Z1231 Encounter for screening mammogram for malignant neoplasm of breast: Secondary | ICD-10-CM

## 2024-02-10 ENCOUNTER — Ambulatory Visit
Admission: RE | Admit: 2024-02-10 | Discharge: 2024-02-10 | Disposition: A | Discharge status: Home / Self Care | Payer: No Typology Code available for payment source | Source: Ambulatory Visit | Attending: Family Medicine | Admitting: Family Medicine

## 2024-02-10 DIAGNOSIS — Z1231 Encounter for screening mammogram for malignant neoplasm of breast: Secondary | ICD-10-CM

## 2024-09-26 ENCOUNTER — Emergency Department (HOSPITAL_BASED_OUTPATIENT_CLINIC_OR_DEPARTMENT_OTHER)

## 2024-09-26 ENCOUNTER — Other Ambulatory Visit: Payer: Self-pay

## 2024-09-26 ENCOUNTER — Encounter (HOSPITAL_BASED_OUTPATIENT_CLINIC_OR_DEPARTMENT_OTHER): Payer: Self-pay | Admitting: Emergency Medicine

## 2024-09-26 ENCOUNTER — Emergency Department (HOSPITAL_BASED_OUTPATIENT_CLINIC_OR_DEPARTMENT_OTHER)
Admission: EM | Admit: 2024-09-26 | Discharge: 2024-09-26 | Disposition: A | Discharge status: Home / Self Care | Attending: Emergency Medicine | Admitting: Emergency Medicine

## 2024-09-26 DIAGNOSIS — K859 Acute pancreatitis without necrosis or infection, unspecified: Secondary | ICD-10-CM | POA: Diagnosis not present

## 2024-09-26 DIAGNOSIS — Z7902 Long term (current) use of antithrombotics/antiplatelets: Secondary | ICD-10-CM | POA: Insufficient documentation

## 2024-09-26 DIAGNOSIS — R109 Unspecified abdominal pain: Secondary | ICD-10-CM | POA: Diagnosis present

## 2024-09-26 LAB — URINALYSIS, MICROSCOPIC (REFLEX)

## 2024-09-26 LAB — CBC
HCT: 43.4 % (ref 36.0–46.0)
Hemoglobin: 14.2 g/dL (ref 12.0–15.0)
MCH: 31.6 pg (ref 26.0–34.0)
MCHC: 32.7 g/dL (ref 30.0–36.0)
MCV: 96.7 fL (ref 80.0–100.0)
Platelets: 226 K/uL (ref 150–400)
RBC: 4.49 MIL/uL (ref 3.87–5.11)
RDW: 12.6 % (ref 11.5–15.5)
WBC: 5.8 K/uL (ref 4.0–10.5)
nRBC: 0 % (ref 0.0–0.2)

## 2024-09-26 LAB — URINALYSIS, ROUTINE W REFLEX MICROSCOPIC
Bilirubin Urine: NEGATIVE
Glucose, UA: NEGATIVE mg/dL
Ketones, ur: NEGATIVE mg/dL
Nitrite: NEGATIVE
Protein, ur: NEGATIVE mg/dL
Specific Gravity, Urine: 1.015 (ref 1.005–1.030)
pH: 6 (ref 5.0–8.0)

## 2024-09-26 LAB — COMPREHENSIVE METABOLIC PANEL WITH GFR
ALT: 18 U/L (ref 0–44)
AST: 20 U/L (ref 15–41)
Albumin: 4.2 g/dL (ref 3.5–5.0)
Alkaline Phosphatase: 98 U/L (ref 38–126)
Anion gap: 11 (ref 5–15)
BUN: 14 mg/dL (ref 8–23)
CO2: 25 mmol/L (ref 22–32)
Calcium: 9.5 mg/dL (ref 8.9–10.3)
Chloride: 103 mmol/L (ref 98–111)
Creatinine, Ser: 0.99 mg/dL (ref 0.44–1.00)
GFR, Estimated: >60 mL/min (ref >60)
Glucose, Bld: 92 mg/dL (ref 70–99)
Potassium: 4 mmol/L (ref 3.5–5.1)
Sodium: 138 mmol/L (ref 135–145)
Total Bilirubin: 0.5 mg/dL (ref 0.0–1.2)
Total Protein: 7.3 g/dL (ref 6.5–8.1)

## 2024-09-26 LAB — LIPASE, BLOOD: Lipase: 249 U/L — ABNORMAL HIGH (ref 11–51)

## 2024-09-26 MED ORDER — MORPHINE SULFATE (PF) 4 MG/ML IV SOLN
4.0000 mg | Freq: Once | INTRAVENOUS | Status: AC
  Administered 2024-09-26: 4 mg via INTRAVENOUS
  Filled 2024-09-26: qty 1

## 2024-09-26 MED ORDER — ONDANSETRON 4 MG PO TBDP: ORAL_TABLET | ORAL | 0 refills | Status: AC

## 2024-09-26 MED ORDER — ONDANSETRON HCL 4 MG/2ML IJ SOLN
4.0000 mg | Freq: Once | INTRAMUSCULAR | Status: AC
  Administered 2024-09-26: 4 mg via INTRAVENOUS
  Filled 2024-09-26: qty 2

## 2024-09-26 MED ORDER — IOHEXOL 300 MG/ML  SOLN
100.0000 mL | Freq: Once | INTRAMUSCULAR | Status: AC | PRN
  Administered 2024-09-26: 100 mL via INTRAVENOUS

## 2024-09-26 MED ORDER — SODIUM CHLORIDE 0.9 % IV BOLUS
1000.0000 mL | Freq: Once | INTRAVENOUS | Status: AC
  Administered 2024-09-26: 1000 mL via INTRAVENOUS

## 2024-09-26 MED ORDER — MORPHINE SULFATE 15 MG PO TABS: 7.5000 mg | ORAL_TABLET | ORAL | 0 refills | Status: DC | PRN

## 2024-09-26 NOTE — ED Provider Notes (Signed)
 [place] [facility] Provider Note   CSN: [id number] Arrival date & time: 09/26/24  1813     Patient presents with: Abdominal Pain   Kimberly Watts is a 63 y.o. female.   63 yo F with a chief point of abdominal discomfort.  Going on for a few days now.  She said the pain started on the right side and now is kind of all over.  Seems to come and go.  Some maybe she was constipated and took some medication.  Has not had any improvement.  Nausea and vomiting at the onset but that is improved.  No fevers no known sick contacts no suspicious food intake.  The pain is also in the back.  Described as burning.   Abdominal Pain      Prior to Admission medications   Medication Sig Start Date End Date Taking? Authorizing Provider  morphine  (MSIR) 15 MG tablet Take 0.5 tablets (7.5 mg total) by mouth every 4 (four) hours as needed for severe pain (pain score 7-10). 09/26/24  Yes Emil Share, DO  ondansetron  (ZOFRAN -ODT) 4 MG disintegrating tablet 4mg  ODT q4 hours prn nausea/vomit 09/26/24  Yes Emil Share, DO  amLODipine -benazepril  (LOTREL) 5-10 MG per capsule Take 1 capsule by mouth daily.    [provider]  atenolol  (TENORMIN ) 25 MG tablet Take 50 mg by mouth daily.     [provider]  baclofen (LIORESAL) 20 MG tablet SMARTSIG:1 Tablet(s) By Mouth Every 8-12 Hours PRN 02/21/20   [provider]  Calcium  Carb-Cholecalciferol 600-200 MG-UNIT TABS 1 tablet with food    [provider]  calcium -vitamin D  (OSCAL WITH D) 500-200 MG-UNIT per tablet Take 1 tablet by mouth daily.    [provider]  clopidogrel  (PLAVIX ) 75 MG tablet Take 1 tablet (75 mg total) by mouth daily. 12/22/17   Rai, Nydia POUR, MD  conjugated estrogens (PREMARIN) vaginal cream See admin instructions. 10/17/20   [provider]  COVID-19 mRNA Vac-TriS, Pfizer, (PFIZER-BIONT COVID-19 VAC-TRIS) SUSP injection Inject into the muscle. 06/20/21    Luiz Channel, MD  cyclobenzaprine (FLEXERIL) 10 MG tablet Take 10 mg by mouth every 8 (eight) hours as needed. 01/27/20   [provider]  diclofenac  (VOLTAREN ) 75 MG EC tablet Take 1 tablet (75 mg total) by mouth 2 (two) times daily. 05/22/23   Magdalen Pasco RAMAN, DPM  dicyclomine  (BENTYL ) 20 MG tablet 1 Tablet orally up to every 6 hours  as needed for abdominal cramps    [provider]  fluconazole (DIFLUCAN) 150 MG tablet 1 TABLET ORALLY ONE DOSE, CAN REPEAT IN 3 DAYS 1 DAY 10/08/20   [provider]  fluticasone  (FLONASE  ALLERGY RELIEF) 50 MCG/ACT nasal spray 1 spray in each nostril 05/20/18   [provider]  ibuprofen (ADVIL) 800 MG tablet TAKE 1 TABLET BY MOUTH EVERY 8 HOURS WITH FOOD OR MILK AS NEEDED 30 DAYS 12/10/20   [provider]  levothyroxine  (SYNTHROID , LEVOTHROID) 50 MCG tablet Take 50 mcg by mouth at bedtime. 10/11/17   [provider]  meclizine  (ANTIVERT ) 12.5 MG tablet Take 1 tablet (12.5 mg total) by mouth 3 (three) times daily as needed for dizziness. 12/29/19   Long, Joshua G, MD  meloxicam (MOBIC) 15 MG tablet 1 tablet    [provider]  methocarbamol (ROBAXIN) 500 MG tablet Take 500 mg by mouth every 6 (six) hours as needed. 02/24/20   [provider]  metroNIDAZOLE  (FLAGYL ) 500 MG  tablet Take 1 tablet (500 mg total) by mouth 2 (two) times daily. One po bid x 7 days 10/29/21   Lenor Hollering, MD  omeprazole (PRILOSEC) 20 MG capsule Take 20 mg by mouth daily.    [provider]  polyethylene glycol (MIRALAX  / GLYCOLAX ) 17 g packet Take 17 g by mouth daily. 09/09/22   Zheng, Michael, DO  senna (SENOKOT) 8.6 MG TABS tablet Take 1 tablet (8.6 mg total) by mouth daily. 09/09/22   Zheng, Michael, DO  simvastatin  (ZOCOR ) 80 MG tablet Take 1 tablet (80 mg total) by mouth daily. 03/14/19   Whitfield Raisin, NP  topiramate  (TOPAMAX ) 50 MG tablet Take 0.5 tablets (25 mg total) by mouth 2 (two) times daily. Start 1  tablet daily x 1 week and then twice daily 07/12/20   [place]field Raisin, NP    Allergies: Adalat [nifedipine]    Review of Systems  Gastrointestinal:  Positive for abdominal pain.    Updated Vital Signs BP 137/63   Pulse 63   Temp 98 F (36.7 C)   Resp 20   Ht 5' 5 (1.651 m)   Wt 114.8 kg   SpO2 100%   BMI 42.10 kg/m   Physical Exam Vitals and nursing note reviewed.  Constitutional:      General: She is not in acute distress.    Appearance: She is well-developed. She is not diaphoretic.  HENT:     Head: Normocephalic and atraumatic.  Eyes:     Pupils: Pupils are equal, round, and reactive to light.  Cardiovascular:     Rate and Rhythm: Normal rate and regular rhythm.     Heart sounds: No murmur heard.    No friction rub. No gallop.  Pulmonary:     Effort: Pulmonary effort is normal.     Breath sounds: No wheezing or rales.  Abdominal:     General: There is no distension.     Palpations: Abdomen is soft.     Tenderness: There is abdominal tenderness.     Comments: Mild diffuse abdominal discomfort without obvious focality  Musculoskeletal:        General: No tenderness.     Cervical back: Normal range of motion and neck supple.  Skin:    General: Skin is warm and dry.  Neurological:     Mental Status: She is alert and oriented to person, place, and time.  Psychiatric:        Behavior: Behavior normal.     (all labs ordered are listed, but only abnormal results are displayed) Labs Reviewed  LIPASE, BLOOD - Abnormal; Notable for the following components:      Result Value   Lipase 249 (*)    All other components within normal limits  URINALYSIS, ROUTINE W REFLEX MICROSCOPIC - Abnormal; Notable for the following components:   Hgb urine dipstick SMALL (*)    Leukocytes,Ua TRACE (*)    All other components within normal limits  URINALYSIS, MICROSCOPIC (REFLEX) - Abnormal; Notable for the following components:   Bacteria, UA RARE (*)    All other components  within normal limits  COMPREHENSIVE METABOLIC PANEL WITH GFR  CBC    EKG: None  Radiology: CT ABDOMEN PELVIS W CONTRAST Result Date: 09/26/2024 EXAM: CT ABDOMEN AND PELVIS WITH CONTRAST 09/26/2024 09:09:32 PM TECHNIQUE: CT of the abdomen and pelvis was performed with the administration of 100 mL of iohexol  (OMNIPAQUE ) 300 MG/ML solution. Multiplanar reformatted images are provided for review. Automated exposure control, iterative reconstruction, and/or  weight-based adjustment of the mA/kV was utilized to reduce the radiation dose to as low as reasonably achievable. COMPARISON: None available. CLINICAL HISTORY: Pancreatitis suspected; Pancreatitis, acute, severe. Epigastric pain for 1 week, burning in her back. FINDINGS: LOWER CHEST: Tiny hiatal hernia. LIVER: Low-density lesion within the left hepatic lobe likely a simple hepatic cyst. GALLBLADDER AND BILE DUCTS: Gallbladder is unremarkable. No biliary ductal dilatation. SPLEEN: Punctate calcifications within the spleen consistent with sequelae of prior granulomatous disease. Otherwise, no focal lesion. Normal in size. PANCREAS: Intercalation of fat noted along the uncinate process of the pancreas. No definite lesion of the pancreas. No main pancreatic duct dilatation. ADRENAL GLANDS: No acute abnormality. KIDNEYS, URETERS AND BLADDER: No stones in the kidneys or ureters. No hydronephrosis. No perinephric or periureteral stranding. Urinary bladder is unremarkable. No filling defects of the urinary collecting systems on delayed view. GI AND BOWEL: Stomach demonstrates no acute abnormality. No bowel thickening or dilatation of the lumen. Colonic diverticulosis. Normal appendix. PERITONEUM AND RETROPERITONEUM: No ascites. No free air. VASCULATURE: Aorta is normal in caliber. Moderate atherosclerotic plaque of the aorta and its branches. LYMPH NODES: No lymphadenopathy. REPRODUCTIVE ORGANS: Uterus and bilateral adnexal regions are unremarkable. BONES AND SOFT  TISSUES: No acute osseous abnormality. No focal soft tissue abnormality. IMPRESSION: 1. No CT evidence of acute pancreatitis 2. Colonic diverticulosis without evidence of diverticulitis Electronically signed by: Morgane Naveau MD 09/26/2024 09:18 PM EDT RP Workstation: [id number]     Procedures   Medications Ordered in the ED  sodium chloride  0.9 % bolus 1,000 mL (0 mLs Intravenous Stopped 09/26/24 2134)  morphine  (PF) 4 MG/ML injection 4 mg (4 mg Intravenous Given 09/26/24 2005)  ondansetron  (ZOFRAN ) injection 4 mg (4 mg Intravenous Given 09/26/24 2004)  iohexol  (OMNIPAQUE ) 300 MG/ML solution 100 mL (100 mLs Intravenous Contrast Given 09/26/24 2056)                                    Medical Decision Making Amount and/or Complexity of Data Reviewed Labs: ordered. Radiology: ordered.  Risk Prescription drug management.   63 yo F with a chief complaints of diffuse abdominal discomfort.  Patient with colicky type pain.  Had some nausea and vomiting which has resolved.  Wonder if this is a viral syndrome.  No obvious focal tenderness on exam.  Patient is well-appearing nontoxic.  LFTs unremarkable.  Lipase is elevated.  No leukocytosis UA negative for infection.  Patient reassessed and is feeling a bit better.  CT imaging performed no obvious gallbladder pathology.  I discussed results with patient.  Discussed with him benefits of admission.  Patient would like to try and go home.  Will have her follow-up with gastroenterology.  10:08 PM:  I have discussed the diagnosis/risks/treatment options with the patient.  Evaluation and diagnostic testing in the emergency department does not suggest an emergent condition requiring admission or immediate intervention beyond what has been performed at this time.  They will follow up with GI, PCP. We also discussed returning to the ED immediately if new or worsening sx occur. We discussed the sx which are most concerning (e.g., sudden worsening pain,  fever, inability to tolerate by mouth) that necessitate immediate return. Medications administered to the patient during their visit and any new prescriptions provided to the patient are listed below.  Medications given during this visit Medications  sodium chloride  0.9 % bolus 1,000 mL (0 mLs Intravenous Stopped 09/26/24 2134)  morphine  (PF) 4 MG/ML injection 4 mg (4 mg Intravenous Given 09/26/24 2005)  ondansetron  (ZOFRAN ) injection 4 mg (4 mg Intravenous Given 09/26/24 2004)  iohexol  (OMNIPAQUE ) 300 MG/ML solution 100 mL (100 mLs Intravenous Contrast Given 09/26/24 2056)     The patient appears reasonably screen and/or stabilized for discharge and I doubt any other medical condition or other [facility] requiring further screening, evaluation, or treatment in the ED at this time prior to discharge.        Final diagnoses:  Acute pancreatitis, unspecified complication status, unspecified pancreatitis type    ED Discharge Orders          Ordered    morphine  (MSIR) 15 MG tablet  Every 4 hours PRN        09/26/24 2143    ondansetron  (ZOFRAN -ODT) 4 MG disintegrating tablet        09/26/24 2143               Emil Share, DO 09/26/24 2208

## 2024-09-26 NOTE — ED Triage Notes (Signed)
 Lower abd pain x 1 week  burning in her back she states

## 2024-09-26 NOTE — Discharge Instructions (Signed)
 Please follow-up with your family doctor and gastroenterologist in the office.  Please return for uncontrolled pain fever or inability to eat or drink.  Take 4 over the counter ibuprofen tablets 3 times a day or 2 over-the-counter naproxen tablets twice a day for pain. Also take tylenol  1000mg (2 extra strength) four times a day.   Then take the pain medicine if you feel like you need it. Narcotics do not help with the pain, they only make you care about it less.  You can become addicted to this, people may break into your house to steal it.  It will constipate you.  If you drive under the influence of this medicine you can get a DUI.

## 2024-09-29 ENCOUNTER — Emergency Department (HOSPITAL_BASED_OUTPATIENT_CLINIC_OR_DEPARTMENT_OTHER)
Admission: EM | Admit: 2024-09-29 | Discharge: 2024-09-30 | Disposition: A | Discharge status: Home / Self Care | Attending: Emergency Medicine | Admitting: Emergency Medicine

## 2024-09-29 ENCOUNTER — Other Ambulatory Visit: Payer: Self-pay

## 2024-09-29 ENCOUNTER — Emergency Department (HOSPITAL_BASED_OUTPATIENT_CLINIC_OR_DEPARTMENT_OTHER)

## 2024-09-29 ENCOUNTER — Encounter (HOSPITAL_BASED_OUTPATIENT_CLINIC_OR_DEPARTMENT_OTHER): Payer: Self-pay | Admitting: Emergency Medicine

## 2024-09-29 DIAGNOSIS — R1030 Lower abdominal pain, unspecified: Secondary | ICD-10-CM | POA: Insufficient documentation

## 2024-09-29 LAB — CBC
HCT: 44.3 % (ref 36.0–46.0)
Hemoglobin: 14.7 g/dL (ref 12.0–15.0)
MCH: 32 pg (ref 26.0–34.0)
MCHC: 33.2 g/dL (ref 30.0–36.0)
MCV: 96.5 fL (ref 80.0–100.0)
Platelets: 216 10*3/uL (ref 150–400)
RBC: 4.59 MIL/uL (ref 3.87–5.11)
RDW: 12.5 % (ref 11.5–15.5)
WBC: 5 10*3/uL (ref 4.0–10.5)
nRBC: 0 % (ref 0.0–0.2)

## 2024-09-29 LAB — COMPREHENSIVE METABOLIC PANEL WITH GFR
ALT: 13 U/L (ref 0–44)
AST: 19 U/L (ref 15–41)
Albumin: 4.4 g/dL (ref 3.5–5.0)
Alkaline Phosphatase: 88 U/L (ref 38–126)
Anion gap: 14 (ref 5–15)
BUN: 8 mg/dL (ref 8–23)
CO2: 25 mmol/L (ref 22–32)
Calcium: 9.9 mg/dL (ref 8.9–10.3)
Chloride: 101 mmol/L (ref 98–111)
Creatinine, Ser: 1.05 mg/dL — ABNORMAL HIGH (ref 0.44–1.00)
GFR, Estimated: 59 mL/min — ABNORMAL LOW (ref >60)
Glucose, Bld: 91 mg/dL (ref 70–99)
Potassium: 4 mmol/L (ref 3.5–5.1)
Sodium: 139 mmol/L (ref 135–145)
Total Bilirubin: 0.9 mg/dL (ref 0.0–1.2)
Total Protein: 7.6 g/dL (ref 6.5–8.1)

## 2024-09-29 LAB — LIPASE, BLOOD: Lipase: 73 U/L — ABNORMAL HIGH (ref 11–51)

## 2024-09-29 LAB — URINALYSIS, ROUTINE W REFLEX MICROSCOPIC
Bilirubin Urine: NEGATIVE
Glucose, UA: NEGATIVE mg/dL
Ketones, ur: 40 mg/dL — AB
Nitrite: NEGATIVE
Protein, ur: NEGATIVE mg/dL
Specific Gravity, Urine: 1.015 (ref 1.005–1.030)
pH: 6 (ref 5.0–8.0)

## 2024-09-29 LAB — URINALYSIS, MICROSCOPIC (REFLEX)

## 2024-09-29 NOTE — ED Triage Notes (Signed)
 Pt c/o R abd pain since Monday, dx with pancreatitis on Monday, states pain meds are not helping. + nausea.  Pt tolerating clear liquid diet. Reports decreased appetite.   Took Morphine  appx 1730 today.

## 2024-09-29 NOTE — ED Provider Notes (Signed)
 [place] [facility]  Provider Note  CSN: [id number] Arrival date & time: 09/29/24 1959  History Chief Complaint  Patient presents with   Abdominal Pain    Kimberly Watts is a 63 y.o. female recently seen in the ED for abdominal pain had mildly elevated lipase with negative CT. Given Rx for morphine  and recommended clear liquid diet which she has been doing, states she has been feeling hungry but still having some abdominal pain. Her pain is mostly in lower abdomen, no epigastric pain. She has not had a BM since her previous visit 4 days ago. No fever. No vomiting.    Home Medications Prior to Admission medications   Medication Sig Start Date End Date Taking? Authorizing Provider  dicyclomine  (BENTYL ) 20 MG tablet Take 1 tablet (20 mg total) by mouth 2 (two) times daily. 09/30/24  Yes Roselyn Carlin NOVAK, MD  oxyCODONE-acetaminophen  (PERCOCET/ROXICET) 5-325 MG tablet Take 1 tablet by mouth every 6 (six) hours as needed for severe pain (pain score 7-10). 09/30/24  Yes Roselyn Carlin NOVAK, MD  amLODipine -benazepril  (LOTREL) 5-10 MG per capsule Take 1 capsule by mouth daily.    [provider]  atenolol  (TENORMIN ) 25 MG tablet Take 50 mg by mouth daily.     [provider]  baclofen (LIORESAL) 20 MG tablet SMARTSIG:1 Tablet(s) By Mouth Every 8-12 Hours PRN 02/21/20   [provider]  Calcium  Carb-Cholecalciferol 600-200 MG-UNIT TABS 1 tablet with food    [provider]  calcium -vitamin D  (OSCAL WITH D) 500-200 MG-UNIT per tablet Take 1 tablet by mouth daily.    [provider]  clopidogrel  (PLAVIX ) 75 MG tablet Take 1 tablet (75 mg total) by mouth daily. 12/22/17   Rai, Nydia POUR, MD  conjugated estrogens (PREMARIN) vaginal cream See admin instructions. 10/17/20   [provider]  COVID-19 mRNA Vac-TriS, Pfizer, (PFIZER-BIONT COVID-19 VAC-TRIS) SUSP injection Inject into the muscle. 06/20/21   Luiz Channel, MD  cyclobenzaprine (FLEXERIL) 10 MG tablet Take 10 mg by mouth every 8 (eight) hours as needed. 01/27/20   [provider]  diclofenac  (VOLTAREN ) 75 MG EC tablet Take 1 tablet (75 mg total) by mouth 2 (two) times daily. 05/22/23   Magdalen Pasco RAMAN, DPM  fluconazole (DIFLUCAN) 150 MG tablet 1 TABLET ORALLY ONE DOSE, CAN REPEAT IN 3 DAYS 1 DAY 10/08/20   [provider]  fluticasone  (FLONASE  ALLERGY RELIEF) 50 MCG/ACT nasal spray 1 spray in each nostril 05/20/18   [provider]  ibuprofen (ADVIL) 800 MG tablet TAKE 1 TABLET BY MOUTH EVERY 8 HOURS WITH FOOD OR MILK AS NEEDED 30 DAYS 12/10/20   [provider]  levothyroxine  (SYNTHROID , LEVOTHROID) 50 MCG tablet Take 50 mcg by mouth at bedtime. 10/11/17   [provider]  meclizine  (ANTIVERT ) 12.5 MG tablet Take 1 tablet (12.5 mg total) by mouth 3 (three) times daily as needed for dizziness. 12/29/19   Long, Joshua G, MD  meloxicam (MOBIC) 15 MG tablet 1 tablet    [provider]  methocarbamol (ROBAXIN) 500 MG tablet Take 500 mg by mouth every 6 (six) hours as needed. 02/24/20   [provider]  metroNIDAZOLE  (FLAGYL ) 500 MG tablet Take 1 tablet (500 mg total) by mouth 2 (two) times daily. One po bid x 7 days 10/29/21   Lenor Hollering, MD  omeprazole (PRILOSEC) 20 MG capsule Take 20 mg by mouth daily.    [provider]  ondansetron  (ZOFRAN -ODT) 4  MG disintegrating tablet 4mg  ODT q4 hours prn nausea/vomit 09/26/24   Floyd, Dan, DO  polyethylene glycol (MIRALAX  / GLYCOLAX ) 17 g packet Take 17 g by mouth daily. 09/09/22   Zheng, Michael, DO  senna (SENOKOT) 8.6 MG TABS tablet Take 1 tablet (8.6 mg total) by mouth daily. 09/09/22   Zheng, Michael, DO  simvastatin  (ZOCOR ) 80 MG tablet Take 1 tablet (80 mg total) by mouth daily. 03/14/19   Whitfield Raisin, NP  topiramate  (TOPAMAX ) 50 MG tablet Take 0.5 tablets (25 mg total) by mouth 2 (two) times daily. Start 1 tablet daily x 1 week and  then twice daily 07/12/20   Whitfield Raisin, NP     Allergies    Adalat [nifedipine]   Review of Systems   Review of Systems Please see HPI for pertinent positives and negatives  Physical Exam BP (!) 155/77 (BP Location: Right Arm)   Pulse 61   Temp 98.4 F (36.9 C) (Oral)   Resp 16   Ht 5' 5 (1.651 m)   Wt 114.8 kg   SpO2 96%   BMI 42.10 kg/m   Physical Exam Vitals and nursing note reviewed.  Constitutional:      Appearance: Normal appearance.  HENT:     Head: Normocephalic and atraumatic.     Nose: Nose normal.     Mouth/Throat:     Mouth: Mucous membranes are moist.  Eyes:     Extraocular Movements: Extraocular movements intact.     Conjunctiva/sclera: Conjunctivae normal.  Cardiovascular:     Rate and Rhythm: Normal rate.  Pulmonary:     Effort: Pulmonary effort is normal.     Breath sounds: Normal breath sounds.  Abdominal:     General: Abdomen is flat.     Palpations: Abdomen is soft.     Tenderness: There is no abdominal tenderness. There is no guarding. Negative signs include Murphy's sign and McBurney's sign.  Musculoskeletal:        General: No swelling. Normal range of motion.     Cervical back: Neck supple.  Skin:    General: Skin is warm and dry.  Neurological:     General: No focal deficit present.     Mental Status: She is alert.  Psychiatric:        Mood and Affect: Mood normal.     ED Results / Procedures / Treatments   EKG None  Procedures Procedures  Medications Ordered in the ED Medications  dicyclomine  (BENTYL ) capsule 10 mg (10 mg Oral Given 09/30/24 0035)    Initial Impression and Plan  Patient here with persistent abdominal pain since recent visit for mild pancreatitis. Abdomen is benign, suspect she is constipated from opioids given at previous visit. She had taken a laxative prior to coming to the ED earlier this week but has not had BM since then. She had labs done in triage showing unremarkable CBC, CMP, UA and a lipase  improved from previous. Will check xray to eval stool burden.   ED Course   Clinical Course as of 09/30/24 0039  Fri Sep 30, 2024  0037 I personally viewed the images from radiology studies and agree with radiologist interpretation: Xray with some stool and gas, overall her exam is benign and labs improving. Plan discharge with Rx for bentyl , she has miralax  and dulcolax at home. Percocet for pain if not controlled.  [CS]    Clinical Course User Index [CS] Roselyn Carlin NOVAK, MD     MDM Rules/Calculators/A&P Medical Decision  Making Problems Addressed: Lower abdominal pain: acute illness or injury  Amount and/or Complexity of Data Reviewed Labs: ordered. Decision-making details documented in ED Course. Radiology: ordered and independent interpretation performed. Decision-making details documented in ED Course.  Risk Prescription drug management.     Final Clinical Impression(s) / ED Diagnoses Final diagnoses:  Lower abdominal pain    Rx / DC Orders ED Discharge Orders          Ordered    oxyCODONE-acetaminophen  (PERCOCET/ROXICET) 5-325 MG tablet  Every 6 hours PRN        09/30/24 0039    dicyclomine  (BENTYL ) 20 MG tablet  2 times daily        09/30/24 0039             Roselyn Carlin NOVAK, MD 09/30/24 [phone]

## 2024-09-29 NOTE — ED Notes (Signed)
 Pt transferred from [facility] to ED RM 8. Assuming pt care at this time.

## 2024-09-30 MED ORDER — OXYCODONE-ACETAMINOPHEN 5-325 MG PO TABS: 1.0000 | ORAL_TABLET | Freq: Four times a day (QID) | ORAL | 0 refills | Status: AC | PRN

## 2024-09-30 MED ORDER — DICYCLOMINE HCL 20 MG PO TABS: 20.0000 mg | ORAL_TABLET | Freq: Two times a day (BID) | ORAL | 0 refills | Status: AC

## 2024-09-30 MED ORDER — DICYCLOMINE HCL 10 MG PO CAPS
10.0000 mg | ORAL_CAPSULE | Freq: Once | ORAL | Status: AC
  Administered 2024-09-30: 10 mg via ORAL
  Filled 2024-09-30: qty 1

## 2024-10-04 ENCOUNTER — Other Ambulatory Visit: Payer: Self-pay

## 2024-10-04 DIAGNOSIS — R1010 Upper abdominal pain, unspecified: Secondary | ICD-10-CM

## 2024-10-10 ENCOUNTER — Ambulatory Visit
Admission: RE | Admit: 2024-10-10 | Discharge: 2024-10-10 | Disposition: A | Discharge status: Home / Self Care | Source: Ambulatory Visit

## 2024-10-10 DIAGNOSIS — R1010 Upper abdominal pain, unspecified: Secondary | ICD-10-CM

## 2025-01-19 ENCOUNTER — Other Ambulatory Visit: Payer: Self-pay | Admitting: Family Medicine

## 2025-01-19 DIAGNOSIS — Z1231 Encounter for screening mammogram for malignant neoplasm of breast: Secondary | ICD-10-CM

## 2025-02-14 ENCOUNTER — Ambulatory Visit
